# Patient Record
Sex: Male | Born: 1935 | Race: White | Hispanic: No | Marital: Married | Smoking: Never smoker
Health system: Southern US, Community
[De-identification: ages and names within clinical notes are randomized; demographics above are authoritative.]

## PROBLEM LIST (undated history)

## (undated) DIAGNOSIS — C6290 Malignant neoplasm of unspecified testis, unspecified whether descended or undescended: Secondary | ICD-10-CM

## (undated) DIAGNOSIS — C801 Malignant (primary) neoplasm, unspecified: Secondary | ICD-10-CM

## (undated) DIAGNOSIS — I4891 Unspecified atrial fibrillation: Secondary | ICD-10-CM

## (undated) DIAGNOSIS — C61 Malignant neoplasm of prostate: Secondary | ICD-10-CM

## (undated) HISTORY — PX: TESTICLE REMOVAL: SHX68

## (undated) HISTORY — PX: PROSTATE SURGERY: SHX751

---

## 2004-07-24 ENCOUNTER — Ambulatory Visit: Admission: RE | Admit: 2004-07-24 | Discharge: 2004-10-22 | Payer: Self-pay | Admitting: Radiation Oncology

## 2004-07-28 ENCOUNTER — Encounter: Admission: RE | Admit: 2004-07-28 | Discharge: 2004-07-28 | Payer: Self-pay | Admitting: Urology

## 2004-09-02 ENCOUNTER — Ambulatory Visit (HOSPITAL_BASED_OUTPATIENT_CLINIC_OR_DEPARTMENT_OTHER): Admission: RE | Admit: 2004-09-02 | Discharge: 2004-09-02 | Payer: Self-pay | Admitting: Urology

## 2004-09-02 ENCOUNTER — Ambulatory Visit (HOSPITAL_COMMUNITY): Admission: RE | Admit: 2004-09-02 | Discharge: 2004-09-02 | Payer: Self-pay | Admitting: Urology

## 2004-09-25 ENCOUNTER — Ambulatory Visit: Admission: RE | Admit: 2004-09-25 | Discharge: 2004-09-25 | Payer: Self-pay | Admitting: Radiation Oncology

## 2014-01-28 ENCOUNTER — Emergency Department (HOSPITAL_COMMUNITY): Payer: Medicare Other

## 2014-01-28 ENCOUNTER — Observation Stay (HOSPITAL_COMMUNITY)
Admission: EM | Admit: 2014-01-28 | Discharge: 2014-01-29 | Disposition: A | Discharge status: Home / Self Care | Payer: Medicare Other | Attending: Internal Medicine | Admitting: Internal Medicine

## 2014-01-28 ENCOUNTER — Encounter (HOSPITAL_COMMUNITY): Payer: Self-pay | Admitting: Emergency Medicine

## 2014-01-28 DIAGNOSIS — I482 Chronic atrial fibrillation, unspecified: Secondary | ICD-10-CM

## 2014-01-28 DIAGNOSIS — E785 Hyperlipidemia, unspecified: Secondary | ICD-10-CM | POA: Insufficient documentation

## 2014-01-28 DIAGNOSIS — I959 Hypotension, unspecified: Secondary | ICD-10-CM | POA: Diagnosis not present

## 2014-01-28 DIAGNOSIS — E860 Dehydration: Secondary | ICD-10-CM | POA: Diagnosis not present

## 2014-01-28 DIAGNOSIS — Z7901 Long term (current) use of anticoagulants: Secondary | ICD-10-CM | POA: Diagnosis not present

## 2014-01-28 DIAGNOSIS — I83893 Varicose veins of bilateral lower extremities with other complications: Principal | ICD-10-CM | POA: Insufficient documentation

## 2014-01-28 DIAGNOSIS — Z66 Do not resuscitate: Secondary | ICD-10-CM | POA: Insufficient documentation

## 2014-01-28 DIAGNOSIS — R58 Hemorrhage, not elsewhere classified: Secondary | ICD-10-CM | POA: Insufficient documentation

## 2014-01-28 DIAGNOSIS — Z8547 Personal history of malignant neoplasm of testis: Secondary | ICD-10-CM | POA: Diagnosis not present

## 2014-01-28 DIAGNOSIS — I83891 Varicose veins of right lower extremities with other complications: Secondary | ICD-10-CM

## 2014-01-28 DIAGNOSIS — Z8546 Personal history of malignant neoplasm of prostate: Secondary | ICD-10-CM | POA: Insufficient documentation

## 2014-01-28 DIAGNOSIS — N179 Acute kidney failure, unspecified: Secondary | ICD-10-CM | POA: Insufficient documentation

## 2014-01-28 DIAGNOSIS — I10 Essential (primary) hypertension: Secondary | ICD-10-CM | POA: Diagnosis not present

## 2014-01-28 DIAGNOSIS — Z8673 Personal history of transient ischemic attack (TIA), and cerebral infarction without residual deficits: Secondary | ICD-10-CM | POA: Diagnosis not present

## 2014-01-28 DIAGNOSIS — C61 Malignant neoplasm of prostate: Secondary | ICD-10-CM

## 2014-01-28 DIAGNOSIS — I4891 Unspecified atrial fibrillation: Secondary | ICD-10-CM | POA: Diagnosis present

## 2014-01-28 DIAGNOSIS — I83899 Varicose veins of unspecified lower extremities with other complications: Secondary | ICD-10-CM | POA: Diagnosis present

## 2014-01-28 HISTORY — DX: Unspecified atrial fibrillation: I48.91

## 2014-01-28 HISTORY — DX: Malignant neoplasm of unspecified testis, unspecified whether descended or undescended: C62.90

## 2014-01-28 HISTORY — DX: Malignant neoplasm of prostate: C61

## 2014-01-28 HISTORY — DX: Malignant (primary) neoplasm, unspecified: C80.1

## 2014-01-28 LAB — CBC WITH DIFFERENTIAL/PLATELET
BASOS ABS: 0 10*3/uL (ref 0.0–0.1)
Basophils Relative: 0 % (ref 0–1)
EOS ABS: 0.1 10*3/uL (ref 0.0–0.7)
EOS PCT: 1 % (ref 0–5)
HEMATOCRIT: 38.9 % — AB (ref 39.0–52.0)
HEMOGLOBIN: 13.4 g/dL (ref 13.0–17.0)
Lymphocytes Relative: 11 % — ABNORMAL LOW (ref 12–46)
Lymphs Abs: 1.1 10*3/uL (ref 0.7–4.0)
MCH: 31.8 pg (ref 26.0–34.0)
MCHC: 34.4 g/dL (ref 30.0–36.0)
MCV: 92.4 fL (ref 78.0–100.0)
Monocytes Absolute: 0.9 10*3/uL (ref 0.1–1.0)
Monocytes Relative: 9 % (ref 3–12)
NEUTROS ABS: 8.2 10*3/uL — AB (ref 1.7–7.7)
NEUTROS PCT: 79 % — AB (ref 43–77)
PLATELETS: 161 10*3/uL (ref 150–400)
RBC: 4.21 MIL/uL — ABNORMAL LOW (ref 4.22–5.81)
RDW: 13.5 % (ref 11.5–15.5)
WBC: 10.3 10*3/uL (ref 4.0–10.5)

## 2014-01-28 LAB — COMPREHENSIVE METABOLIC PANEL
ALK PHOS: 60 U/L (ref 39–117)
ALT: 14 U/L (ref 0–53)
AST: 22 U/L (ref 0–37)
Albumin: 3.3 g/dL — ABNORMAL LOW (ref 3.5–5.2)
Anion gap: 16 — ABNORMAL HIGH (ref 5–15)
BUN: 24 mg/dL — AB (ref 6–23)
CALCIUM: 9 mg/dL (ref 8.4–10.5)
CO2: 22 mEq/L (ref 19–32)
Chloride: 106 mEq/L (ref 96–112)
Creatinine, Ser: 1.45 mg/dL — ABNORMAL HIGH (ref 0.50–1.35)
GFR calc Af Amer: 50 mL/min — ABNORMAL LOW (ref >90)
GFR, EST NON AFRICAN AMERICAN: 43 mL/min — AB (ref >90)
GLUCOSE: 120 mg/dL — AB (ref 70–99)
Potassium: 4.2 mEq/L (ref 3.7–5.3)
SODIUM: 144 meq/L (ref 137–147)
TOTAL PROTEIN: 6.2 g/dL (ref 6.0–8.3)
Total Bilirubin: 0.4 mg/dL (ref 0.3–1.2)

## 2014-01-28 LAB — URINALYSIS, ROUTINE W REFLEX MICROSCOPIC
BILIRUBIN URINE: NEGATIVE
Glucose, UA: NEGATIVE mg/dL
Hgb urine dipstick: NEGATIVE
KETONES UR: NEGATIVE mg/dL
LEUKOCYTES UA: NEGATIVE
Nitrite: NEGATIVE
PROTEIN: NEGATIVE mg/dL
SPECIFIC GRAVITY, URINE: 1.018 (ref 1.005–1.030)
Urobilinogen, UA: 0.2 mg/dL (ref 0.0–1.0)
pH: 6 (ref 5.0–8.0)

## 2014-01-28 LAB — LACTIC ACID, PLASMA: LACTIC ACID, VENOUS: 3.6 mmol/L — AB (ref 0.5–2.2)

## 2014-01-28 MED ORDER — SODIUM CHLORIDE 0.9 % IV BOLUS (SEPSIS)
1000.0000 mL | Freq: Once | INTRAVENOUS | Status: AC
  Administered 2014-01-28: 1000 mL via INTRAVENOUS

## 2014-01-28 MED ORDER — ACETAMINOPHEN 325 MG PO TABS
650.0000 mg | ORAL_TABLET | Freq: Once | ORAL | Status: AC
  Administered 2014-01-28: 650 mg via ORAL
  Filled 2014-01-28: qty 2

## 2014-01-28 MED ORDER — METOPROLOL TARTRATE 1 MG/ML IV SOLN
2.5000 mg | Freq: Once | INTRAVENOUS | Status: AC
  Administered 2014-01-28: 2.5 mg via INTRAVENOUS
  Filled 2014-01-28: qty 5

## 2014-01-28 NOTE — ED Provider Notes (Signed)
CSN: [id number]     Arrival date & time 01/28/14  1635 History   First MD Initiated Contact with Patient 01/28/14 1647     Chief Complaint  Patient presents with  . Coagulation Disorder     (Consider location/radiation/quality/duration/timing/severity/associated sxs/prior Treatment) HPI  Colton Bonilla is a 80 y.o. male with a past medical history of atrial fibrillation, and prostate cancer, presenting for blood loss and hypotension. Patient was in the bathroom earlier today taking off his pants in preparation for a shower, and he injured an area on his ankle which started bleeding profusely. Patient is not sure how much blood he lost, but states that he felt weak and laid down. Denies falling or loss of consciousness. Family contacted EMS and a pressure dressing was applied to the patient's ankle upon arrival. Bleeding controlled following pressure dressing, however EMS concerned for arterial bleeding due to profuse bleeding. EMS unable to obtain pulses following application of pressure dressing. Patient is currently on Pradaxa for his atrial fibrillation. Following arrival of family, they state that the patient had been mowing today for approximately 3-1/2 hours. Stated the patient does not drink very much water on a regular basis and had not had any water following mowing today as he was about to take a shower before his bleeding started. Patient denies chest pain, shortness of breath, nausea, vomiting, palpitations, diarrhea. Endorses generalized weakness and feeling "faint", and dizzy.     Past Medical History  Diagnosis Date  . A-fib   . Cancer   . Prostate cancer   . Testicular cancer    Past Surgical History  Procedure Laterality Date  . Testicle removal Right   . Prostate surgery     Family History  Problem Relation Age of Onset  . Cancer Father    History  Substance Use Topics  . Smoking status: Never Smoker   . Smokeless tobacco: Never Used  . Alcohol Use: No     Review of Systems  HENT: Negative.   Eyes: Negative.   Respiratory: Negative.   Cardiovascular: Negative.   Gastrointestinal: Negative.   Endocrine: Negative.   Genitourinary: Negative.   Musculoskeletal: Negative.   Skin: Positive for wound.  Allergic/Immunologic: Negative.   Neurological: Positive for weakness.  Hematological: Negative.   Psychiatric/Behavioral: Negative.       Allergies  Review of patient's allergies indicates no known allergies.  Home Medications   Prior to Admission medications   Medication Sig Start Date End Date Taking? Authorizing Provider  acetaminophen (TYLENOL) 500 MG tablet Take 500 mg by mouth every 6 (six) hours as needed for moderate pain.   Yes Historical Provider, MD  Ascorbic Acid (VITAMIN C) 1000 MG tablet Take 1,000 mg by mouth daily.   Yes Historical Provider, MD  Bromfenac Sodium (PROLENSA) 0.07 % SOLN Place 1 drop into the left eye every evening.   Yes Historical Provider, MD  metoprolol tartrate (LOPRESSOR) 25 MG tablet Take 6.25 mg by mouth daily as needed (for high BP).   Yes Historical Provider, MD  Multiple Vitamin (MULTIVITAMIN WITH MINERALS) TABS tablet Take 1 tablet by mouth daily.   Yes Historical Provider, MD  Omega-3 Fatty Acids (FISH OIL) 1000 MG CPDR Take 1,000 mg by mouth daily.   Yes Historical Provider, MD  PRADAXA 150 MG CAPS capsule Take 150 mg by mouth 2 (two) times daily. 01/02/14  Yes Historical Provider, MD  simvastatin (ZOCOR) 40 MG tablet Take 40 mg by mouth daily at 6 PM.   Yes  Historical Provider, MD   BP 106/83  Pulse 111  Resp 15  SpO2 99% Physical Exam  Nursing note and vitals reviewed. Constitutional: He is oriented to person, place, and time. He appears well-developed and well-nourished. He appears distressed.  HENT:  Head: Normocephalic and atraumatic.  Right Ear: External ear normal.  Left Ear: External ear normal.  Nose: Nose normal.  Mouth/Throat: Oropharynx is clear and moist. No  oropharyngeal exudate.  Eyes: Conjunctivae and EOM are normal. Pupils are equal, round, and reactive to light. Right eye exhibits no discharge. Left eye exhibits no discharge. No scleral icterus.  Neck: Normal range of motion. Neck supple. No JVD present. No tracheal deviation present. No thyromegaly present.  Cardiovascular: Normal heart sounds and intact distal pulses.  An irregularly irregular rhythm present. Tachycardia present.  Exam reveals no gallop and no friction rub.   No murmur heard. Pulmonary/Chest: Effort normal and breath sounds normal. No stridor. No respiratory distress. He has no wheezes. He has no rales. He exhibits no tenderness.  Abdominal: Soft. Bowel sounds are normal. He exhibits no distension. There is no tenderness. There is no rebound and no guarding.  Musculoskeletal: Normal range of motion. He exhibits no edema and no tenderness.  Lymphadenopathy:    He has no cervical adenopathy.  Neurological: He is alert and oriented to person, place, and time. He has normal strength. He is not disoriented. He displays no atrophy and no tremor. No cranial nerve deficit or sensory deficit. He exhibits normal muscle tone. He displays no seizure activity. Coordination normal. GCS eye subscore is 4. GCS verbal subscore is 5. GCS motor subscore is 6.  Skin: Skin is warm. Lesion noted. No rash noted. He is diaphoretic. No erythema. No pallor.     Psychiatric: He has a normal mood and affect. His behavior is normal. Judgment and thought content normal.    ED Course  Procedures (including critical care time) Labs Review Labs Reviewed  CBC WITH DIFFERENTIAL - Abnormal; Notable for the following:    RBC 4.21 (*)    HCT 38.9 (*)    Neutrophils Relative % 79 (*)    Neutro Abs 8.2 (*)    Lymphocytes Relative 11 (*)    All other components within normal limits  COMPREHENSIVE METABOLIC PANEL - Abnormal; Notable for the following:    Glucose, Bld 120 (*)    BUN 24 (*)    Creatinine, Ser  1.45 (*)    Albumin 3.3 (*)    GFR calc non Af Amer 43 (*)    GFR calc Af Amer 50 (*)    Anion gap 16 (*)    All other components within normal limits  LACTIC ACID, PLASMA - Abnormal; Notable for the following:    Lactic Acid, Venous 3.6 (*)    All other components within normal limits  URINALYSIS, ROUTINE W REFLEX MICROSCOPIC - Abnormal; Notable for the following:    APPearance CLOUDY (*)    All other components within normal limits  APTT - Abnormal; Notable for the following:    aPTT 41 (*)    All other components within normal limits  PROTIME-INR - Abnormal; Notable for the following:    Prothrombin Time 18.9 (*)    INR 1.58 (*)    All other components within normal limits  RETICULOCYTES - Abnormal; Notable for the following:    RBC. 3.59 (*)    All other components within normal limits  CBC - Abnormal; Notable for the following:  RBC 3.59 (*)    Hemoglobin 11.5 (*)    HCT 33.1 (*)    Platelets 148 (*)    All other components within normal limits    Imaging Review Dg Chest Portable 1 View  01/28/2014   CLINICAL DATA:  Shortness of breath  EXAM: PORTABLE CHEST - 1 VIEW  COMPARISON:  06/17/2011  FINDINGS: The heart size and mediastinal contours are within normal limits. Both lungs are clear. The visualized skeletal structures are unremarkable.  IMPRESSION: No active disease.   Electronically Signed   By: Kathreen Devoid   On: 01/28/2014 17:35     EKG Interpretation   Date/Time:  Monday January 28 2014 16:52:46 EDT Ventricular Rate:  123 PR Interval:    QRS Duration: 85 QT Interval:  363 QTC Calculation: 519 R Axis:   60 Text Interpretation:  Atrial fibrillation with rapid ventricular response  Borderline low voltage, extremity leads Prolonged QT interval No old  tracing to compare Confirmed by [facility]  MD, DAVID ([id number]) on 01/28/2014  5:11:16 PM      MDM   Final diagnoses:  Bleeding from varicose vein, right  Dehydration    Upon arrival to the emergency  department patient noted to be hypotensive, tachycardic. Based on EMS description patient did not have significant blood loss, approximately 50 cc. Initially concerned primarily for other causes of hypotension based on patient's presentation. Normal saline boluses administered and initial laboratory workup started. Upon obtaining information from family, patient's presentation and history much more consistent with dehydration. Laboratory workup shows AKI and mild lactic acidosis consistent with dehydration patient's hypotension improved with fluid boluses, and heart rate improved with a 2.5 mg dose of IV metoprolol. No acute abnormalities noted on workup studies to suggest alternate etiology. Patient improved symptomatically with fluids and rate control. Will need observation for further management of AKI, and dehydration.  Pt discussed with hospitalist service.  Patient care was discussed with my attending, Dr. Roxanne Mins.   Hoyle Sauer, MD 01/29/14 [phone]

## 2014-01-28 NOTE — H&P (Signed)
[facility] History and Physical  Patient: Colton Bonilla  SWF:[id number]  DOB: 1929-12-13  DOS: the patient was seen and examined on 01/28/2014 PCP: Ann Held, MD  Chief Complaint: Bleeding from the leg  HPI: Colton Bonilla is a 80 y.o. male with Past medical history of atrial fibrillation, prostate cancer, hypertension, dyslipidemia, CVA. The patient presented with complaints of a bleeding episode noted from the right leg. 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 The patient and his wife feel that he had a bump at that area which broke off and started bleeding. The patient is on Protonix and since last 3 years for his history of CVA leading to A. fib. He denies any further bleeding episode he denies any bleeding anywhere else. Denies any injury or lightheadedness. He complains of fatigue and tiredness and exhaustion. He also complains of being dehydrated. He denies any heat or chills burning urination. There is no recent change in his medication.  The patient is coming from home. And at his baseline independent for most of his ADL.  Review of Systems: as mentioned in the history of present illness.  A Comprehensive review of the other systems is negative.  Past Medical History  Diagnosis Date  . A-fib   . Cancer   . Prostate cancer   . Testicular cancer    Past Surgical History  Procedure Laterality Date  . Testicle removal Right    Social History:  reports that he has never smoked. He has never used smokeless tobacco. He reports that he does not drink alcohol or use illicit drugs.  No Known Allergies  History reviewed. No pertinent family history.  Prior to Admission medications   Medication Sig Start Date End Date Taking? Authorizing Provider  acetaminophen (TYLENOL) 500 MG tablet  Take 500 mg by mouth every 6 (six) hours as needed for moderate pain.   Yes Historical Provider, MD  Ascorbic Acid (VITAMIN C) 1000 MG tablet Take 1,000 mg by mouth daily.   Yes Historical Provider, MD  Bromfenac Sodium (PROLENSA) 0.07 % SOLN Place 1 drop into the left eye every evening.   Yes Historical Provider, MD  metoprolol tartrate (LOPRESSOR) 25 MG tablet Take 6.25 mg by mouth daily as needed (for high BP).   Yes Historical Provider, MD  Multiple Vitamin (MULTIVITAMIN WITH MINERALS) TABS tablet Take 1 tablet by mouth daily.   Yes Historical Provider, MD  Omega-3 Fatty Acids (FISH OIL) 1000 MG CPDR Take 1,000 mg by mouth daily.   Yes Historical Provider, MD  PRADAXA 150 MG CAPS capsule Take 150 mg by mouth 2 (two) times daily. 01/02/14  Yes Historical Provider, MD  simvastatin (ZOCOR) 40 MG tablet Take 40 mg by mouth daily at 6 PM.   Yes Historical Provider, MD    Physical Exam: Filed Vitals:   01/28/14 2130 01/28/14 2215 01/28/14 2245 01/28/14 2315  BP: 102/67 113/74 108/70 109/77  Pulse: 92 92 93 100  Temp:   98.5 F (36.9 C)   TempSrc:   Oral   Resp: 18 22 20 21   SpO2: 97% 98% 98% 97%    General: Alert, Awake and Oriented to Time, Place and Person. Appear in mild distress Eyes: PERRL ENT: Oral Mucosa clear dry. Neck: no JVD Cardiovascular: S1 and S2  Present, aortic systolic Murmur, Peripheral Pulses Present Respiratory: Bilateral Air entry equal and Decreased, Clear to Auscultation, noCrackles, no wheezes Abdomen: Bowel Sound Present, Soft and Non tender Skin: no Rash Extremities: no Pedal edema, no calf tenderness, small ulcer with redness but no bleeding on the right foot Neurologic: Grossly no focal neuro deficit.  Labs on Admission:  CBC:  Recent Labs Lab 01/28/14 1755  WBC 10.3  NEUTROABS 8.2*  HGB 13.4  HCT 38.9*  MCV 92.4  PLT 161    CMP     Component Value Date/Time   NA 144 01/28/2014 1755   K 4.2 01/28/2014 1755   CL 106 01/28/2014 1755   CO2 22  01/28/2014 1755   GLUCOSE 120* 01/28/2014 1755   BUN 24* 01/28/2014 1755   CREATININE 1.45* 01/28/2014 1755   CALCIUM 9.0 01/28/2014 1755   PROT 6.2 01/28/2014 1755   ALBUMIN 3.3* 01/28/2014 1755   AST 22 01/28/2014 1755   ALT 14 01/28/2014 1755   ALKPHOS 60 01/28/2014 1755   BILITOT 0.4 01/28/2014 1755   GFRNONAA 43* 01/28/2014 1755   GFRAA 50* 01/28/2014 1755    No results found for this basename: LIPASE, AMYLASE,  in the last 168 hours No results found for this basename: AMMONIA,  in the last 168 hours  No results found for this basename: CKTOTAL, CKMB, CKMBINDEX, TROPONINI,  in the last 168 hours BNP (last 3 results) No results found for this basename: PROBNP,  in the last 8760 hours  Radiological Exams on Admission: Dg Chest Portable 1 View  01/28/2014   CLINICAL DATA:  Shortness of breath  EXAM: PORTABLE CHEST - 1 VIEW  COMPARISON:  06/17/2011  FINDINGS: The heart size and mediastinal contours are within normal limits. Both lungs are clear. The visualized skeletal structures are unremarkable.  IMPRESSION: No active disease.   Electronically Signed   By: Kathreen Devoid   On: 01/28/2014 17:35    Assessment/Plan Principal Problem:   Bleeding from varicose vein Active Problems:   Dehydration   A-fib   Prostate cancer   1. Bleeding from varicose vein The patient is presenting with complaints of bleeding from the the foot possibly of varicose vein. The bleeding at present has stopped. Patient is on pradexa for A. Fib. At present the bleeding to stop and we'll continue to monitor him for the same. We'll repeat his H&H. Hold vertex at present. Also holding SCD at present.  2. Dehydration. Patient has been working outside in the heat and appears to have mild elevation of his BUN and creatinine. He also has mild elevation of her lactic acid With which at present I would continue him on IV hydration and monitor him on telemetry.  3. A. fib with RVR. Patient also had A. fib with RVR. Which  has improved significantly with IV hydration and Lopressor. At present I would start him on Lopressor 12.5 mg twice a day and continue to monitor him on telemetry. Continue IV hydration.  4. History of prostate cancer. Continue home medication.  DVT Prophylaxis: on chronic anticoagulation Nutrition: Cardiac diet  Code Status: DO NOT RESUSCITATE  Family Communication: Wife was present at bedside, opportunity was given to ask question and all questions were answered satisfactorily at the time of interview. Disposition: Admitted to observation in telemetry unit.  Author: Berle Mull, MD [facility] Pager: [phone] 01/28/2014, 11:49 PM    If 7PM-7AM, please contact night-coverage [email or web address] Password [email or web address]  **Disclaimer: This note may have been dictated with voice  recognition software. Similar sounding words can inadvertently be transcribed and this note may contain transcription errors which may not have been corrected upon publication of note.**

## 2014-01-28 NOTE — ED Notes (Signed)
Warm fluids hung on pt. Pt was shivering under warm blankets. Thermostat turned up in room

## 2014-01-28 NOTE — ED Notes (Signed)
Pt in from home via [facility], per report pt had elevated raised area on R foot that was removed accidentally before taking a shower, per report pt lost 50 cc estimated of blood, fire dept wrapped foot, bleeding controlled upon arrival to ED, pt in a-fib, with hx of a fib, pt takes Pradaxa, pt A&O x 4, follows commands, speaks in complete sentences, denies CP, SOB, N/v/d, pt 500 mL NS PTA

## 2014-01-28 NOTE — ED Notes (Signed)
Portable xray completed

## 2014-01-28 NOTE — ED Notes (Addendum)
Dr. Estanislado Spire back in to speak with family and pt. MD okay with pt receiving home eye drops to his left eye for cataract.

## 2014-01-28 NOTE — ED Provider Notes (Signed)
80 year old male who is anticoagulated because of atrial fibrillation started bleeding from his left leg as he was going to go in the shower. EMS arrived and then noted that he was hypotensive. Dressing was applied and he was brought to the ED where her blood pressure is noted to be adequate. He is in atrial fibrillation with a slightly rapid ventricular response. Bleeding appears to have come from a varicose vein but bleeding has stopped.  I saw and evaluated the patient, reviewed the resident's note and I agree with the findings and plan.   EKG Interpretation   Date/Time:  Monday January 28 2014 16:52:46 EDT Ventricular Rate:  123 PR Interval:    QRS Duration: 85 QT Interval:  363 QTC Calculation: 519 R Axis:   60 Text Interpretation:  Atrial fibrillation with rapid ventricular response  Borderline low voltage, extremity leads Prolonged QT interval No old  tracing to compare Confirmed by [facility]  MD, Chenee Munns ([id number]) on 01/28/2014  5:11:16 PM        Delora Fuel, MD 80/03/49 1791

## 2014-01-28 NOTE — ED Notes (Signed)
Attempted to call report

## 2014-01-28 NOTE — ED Notes (Signed)
Pt c/o left sided ha at this time.

## 2014-01-29 DIAGNOSIS — I4891 Unspecified atrial fibrillation: Secondary | ICD-10-CM | POA: Diagnosis present

## 2014-01-29 DIAGNOSIS — I83899 Varicose veins of unspecified lower extremities with other complications: Secondary | ICD-10-CM | POA: Diagnosis present

## 2014-01-29 DIAGNOSIS — C61 Malignant neoplasm of prostate: Secondary | ICD-10-CM | POA: Diagnosis present

## 2014-01-29 DIAGNOSIS — I83893 Varicose veins of bilateral lower extremities with other complications: Secondary | ICD-10-CM

## 2014-01-29 LAB — CBC
HCT: 34.4 % — ABNORMAL LOW (ref 39.0–52.0)
HEMATOCRIT: 33.1 % — AB (ref 39.0–52.0)
Hemoglobin: 11.5 g/dL — ABNORMAL LOW (ref 13.0–17.0)
Hemoglobin: 11.5 g/dL — ABNORMAL LOW (ref 13.0–17.0)
MCH: 32 pg (ref 26.0–34.0)
MCH: 31.6 pg (ref 26.0–34.0)
MCHC: 33.4 g/dL (ref 30.0–36.0)
MCHC: 34.7 g/dL (ref 30.0–36.0)
MCV: 92.2 fL (ref 78.0–100.0)
MCV: 94.5 fL (ref 78.0–100.0)
PLATELETS: 153 10*3/uL (ref 150–400)
Platelets: 148 10*3/uL — ABNORMAL LOW (ref 150–400)
RBC: 3.59 MIL/uL — ABNORMAL LOW (ref 4.22–5.81)
RBC: 3.64 MIL/uL — ABNORMAL LOW (ref 4.22–5.81)
RDW: 13.5 % (ref 11.5–15.5)
RDW: 13.8 % (ref 11.5–15.5)
WBC: 6.1 10*3/uL (ref 4.0–10.5)
WBC: 6.2 10*3/uL (ref 4.0–10.5)

## 2014-01-29 LAB — RETICULOCYTES
RBC.: 3.59 MIL/uL — AB (ref 4.22–5.81)
Retic Count, Absolute: 61 10*3/uL (ref 19.0–186.0)
Retic Ct Pct: 1.7 % (ref 0.4–3.1)

## 2014-01-29 LAB — PROTIME-INR
INR: 1.58 — AB (ref 0.00–1.49)
PROTHROMBIN TIME: 18.9 s — AB (ref 11.6–15.2)

## 2014-01-29 LAB — BASIC METABOLIC PANEL
ANION GAP: 9 (ref 5–15)
BUN: 15 mg/dL (ref 6–23)
CALCIUM: 8.5 mg/dL (ref 8.4–10.5)
CO2: 24 mEq/L (ref 19–32)
CREATININE: 0.98 mg/dL (ref 0.50–1.35)
Chloride: 109 mEq/L (ref 96–112)
GFR calc Af Amer: 86 mL/min — ABNORMAL LOW (ref >90)
GFR, EST NON AFRICAN AMERICAN: 74 mL/min — AB (ref >90)
Glucose, Bld: 97 mg/dL (ref 70–99)
Potassium: 4.1 mEq/L (ref 3.7–5.3)
Sodium: 142 mEq/L (ref 137–147)

## 2014-01-29 LAB — APTT: aPTT: 41 seconds — ABNORMAL HIGH (ref 24–37)

## 2014-01-29 MED ORDER — METOPROLOL TARTRATE 12.5 MG HALF TABLET
12.5000 mg | ORAL_TABLET | Freq: Two times a day (BID) | ORAL | Status: DC
  Administered 2014-01-29: 12.5 mg via ORAL
  Filled 2014-01-29 (×2): qty 1

## 2014-01-29 MED ORDER — ONDANSETRON HCL 4 MG PO TABS: 4.0000 mg | ORAL_TABLET | Freq: Four times a day (QID) | ORAL | Status: DC | PRN

## 2014-01-29 MED ORDER — ACETAMINOPHEN 650 MG RE SUPP: 650.0000 mg | Freq: Four times a day (QID) | RECTAL | Status: DC | PRN

## 2014-01-29 MED ORDER — SIMVASTATIN 40 MG PO TABS
40.0000 mg | ORAL_TABLET | Freq: Every day | ORAL | Status: DC
  Filled 2014-01-29: qty 1

## 2014-01-29 MED ORDER — ONDANSETRON HCL 4 MG/2ML IJ SOLN: 4.0000 mg | Freq: Four times a day (QID) | INTRAMUSCULAR | Status: DC | PRN

## 2014-01-29 MED ORDER — SODIUM CHLORIDE 0.9 % IV SOLN
INTRAVENOUS | Status: DC
  Administered 2014-01-29: 02:00:00 via INTRAVENOUS

## 2014-01-29 MED ORDER — SODIUM CHLORIDE 0.9 % IJ SOLN
3.0000 mL | Freq: Two times a day (BID) | INTRAMUSCULAR | Status: DC
  Administered 2014-01-29: 3 mL via INTRAVENOUS

## 2014-01-29 MED ORDER — ACETAMINOPHEN 325 MG PO TABS: 650.0000 mg | ORAL_TABLET | Freq: Four times a day (QID) | ORAL | Status: DC | PRN

## 2014-01-29 NOTE — Discharge Instructions (Signed)
Acute Kidney Injury Acute kidney injury is a disease in which there is sudden (acute) damage to the kidneys. The kidneys are 2 organs that lie on either side of the spine between the middle of the back and the front of the abdomen. The kidneys:  Remove wastes and extra water from the blood.   Produce important hormones. These help keep bones strong, regulate blood pressure, and help create red blood cells.   Balance the fluids and chemicals in the blood and tissues. A small amount of kidney damage may not cause problems, but a large amount of damage may make it difficult or impossible for the kidneys to work the way they should. Acute kidney injury may develop into long-lasting (chronic) kidney disease. It may also develop into a life-threatening disease called end-stage kidney disease. Acute kidney injury can get worse very quickly, so it should be treated right away. Early treatment may prevent other kidney diseases from developing.  CAUSES   A problem with blood flow to the kidneys. This may be caused by:   Blood loss.   Heart disease.   Severe burns.   Liver disease.  Direct damage to the kidneys. This may be caused by:  Some medicines.   A kidney infection.   Poisoning or consuming toxic substances.   A surgical wound.   A blow to the kidney area.   A problem with urine flow. This may be caused by:   Cancer.   Kidney stones.   An enlarged prostate. SYMPTOMS   Swelling (edema) of the legs, ankles, or feet.   Tiredness (lethargy).   Nausea or vomiting.   Confusion.   Problems with urination, such as:   Painful or burning feeling during urination.   Decreased urine production.   Frequent accidents in children who are potty trained.   Bloody urine.   Muscle twitches and cramps.   Shortness of breath.   Seizures.   Chest pain or pressure. Sometimes, no symptoms are present. DIAGNOSIS Acute kidney injury may be detected  and diagnosed by tests, including blood, urine, imaging, or kidney biopsy tests.  TREATMENT Treatment of acute kidney injury varies depending on the cause and severity of the kidney damage. In mild cases, no treatment may be needed. The kidneys may heal on their own. If acute kidney injury is more severe, your caregiver will treat the cause of the kidney damage, help the kidneys heal, and prevent complications from occurring. Severe cases may require a procedure to remove toxic wastes from the body (dialysis) or surgery to repair kidney damage. Surgery may involve:   Repair of a torn kidney.   Removal of an obstruction. Most of the time, you will need to stay overnight at the hospital.  HOME CARE INSTRUCTIONS:  Follow your prescribed diet.  Only take over-the-counter or prescription medicines as directed by your caregiver.  Do not take any new medicines (prescription, over-the-counter, or nutritional supplements) unless approved by your caregiver. Many medicines can worsen your kidney damage or need to have the dose adjusted.   Keep all follow-up appointments as directed by your caregiver.  Observe your condition to make sure you are healing as expected. SEEK IMMEDIATE MEDICAL CARE IF:  You are feeling ill or have severe pain in the back or side.   Your symptoms return or you have new symptoms.  You have any symptoms of end-stage kidney disease. These include:   Persistent itchiness.   Loss of appetite.   Headaches.   Abnormally dark   or light skin.  Numbness in the hands or feet.   Easy bruising.   Frequent hiccups.   Menstruation stops.   You have a fever.  You have increased urine production.  You have pain or bleeding when urinating. MAKE SURE YOU:   Understand these instructions.  Will watch your condition.  Will get help right away if you are not doing well or get worse Document Released: 12/07/2010 Document Revised: 09/18/2012 Document  Reviewed: 01/21/2012 [employer] Patient Information 2015 [employer]. This information is not intended to replace advice given to you by your health care provider. Make sure you discuss any questions you have with your health care provider.  

## 2014-01-29 NOTE — Discharge Summary (Signed)
Physician Discharge Summary  Colton Bonilla NLZ:[id number] DOB: [birth year] DOA: 01/28/2014  PCP: Ann Held, MD  Admit date: 01/28/2014 Discharge date: 01/29/2014  Recommendations for Outpatient Follow-up:  1. Pt will need to follow up with PCP in 2-3 weeks post discharge 2. Please obtain BMP to evaluate electrolytes and kidney function 3. Please also check CBC to evaluate Hg and Hct levels  Discharge Diagnoses:  Principal Problem:   Bleeding from varicose vein Active Problems:   Dehydration   A-fib   Prostate cancer  Discharge Condition: Stable  Diet recommendation: Heart healthy diet discussed in details   History of present illness:  80 y.o. male with atrial fibrillation, prostate cancer, hypertension, dyslipidemia, CVA, presented with complaints of a bleeding episode noted from the right leg. 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 The patient and his wife felt that he had a bump at that area which broke off and started bleeding.   Hospital Course:  Principal Problem:   Bleeding from varicose vein - bleeding stopped and Hg remains stable - pt ambulating well and wants to go home  Active Problems:   Dehydration - tolerating regular diet well, drinking liquids    A-fib - rate controlled - continue Pradaxa    Prostate cancer - outpatient follow up    Procedures/Studies: Dg Chest Portable 1 View 01/28/2014 IMPRESSION: No active disease.     Consultations:  None  Antibiotics:  None   Discharge Exam: Filed Vitals:   01/29/14 1316  BP: 96/69  Pulse: 86  Temp: 97.8 F (36.6 C)  Resp: 19   Filed Vitals:   01/29/14 0010 01/29/14 0522 01/29/14 1041 01/29/14 1316  BP: 106/62 115/85 95/72 96/69   Pulse: 86 85 88 86  Temp: 97.7 F (36.5 C) 98.2 F (36.8 C)  97.8 F (36.6 C)   TempSrc: Oral Oral  Oral  Resp: 20 20  19   Height:  5\' 11"  (1.803 m)    Weight: 86.138 kg (189 lb 14.4 oz)     SpO2: 97% 98%  98%    General: Pt is alert, follows commands appropriately, not in acute distress Cardiovascular: Regular rate and rhythm, S1/S2 +, no murmurs, no rubs, no gallops Respiratory: Clear to auscultation bilaterally, no wheezing, no crackles, no rhonchi Abdominal: Soft, non tender, non distended, bowel sounds +, no guarding Extremities: no edema, no cyanosis, pulses palpable bilaterally DP and PT Neuro: Grossly nonfocal  Discharge Instructions  Discharge Instructions   Diet - low sodium heart healthy    Complete by:  As directed      Increase activity slowly    Complete by:  As directed             Medication List         acetaminophen 500 MG tablet  Commonly known as:  TYLENOL  Take 500 mg by mouth every 6 (six) hours as needed for moderate pain.     Fish Oil 1000 MG Cpdr  Take 1,000 mg by mouth daily.     metoprolol tartrate 25 MG tablet  Commonly known as:  LOPRESSOR  Take 6.25 mg by mouth daily as needed (for high BP).     multivitamin with minerals Tabs tablet  Take 1 tablet by mouth daily.     PRADAXA 150  MG Caps capsule  Generic drug:  dabigatran  Take 150 mg by mouth 2 (two) times daily.     PROLENSA 0.07 % Soln  Generic drug:  Bromfenac Sodium  Place 1 drop into the left eye every evening.     simvastatin 40 MG tablet  Commonly known as:  ZOCOR  Take 40 mg by mouth daily at 6 PM.     vitamin C 1000 MG tablet  Take 1,000 mg by mouth daily.           Follow-up Information   Schedule an appointment as soon as possible for a visit with Ann Held, MD.   Specialty:  Family Medicine   Contact information:   [place] [address] [phone]       Follow up with Faye Ramsay, MD. (As needed call my cell phone [phone])    Specialty:  Internal Medicine   Contact information:   [address] [place] [address] [phone]        The results of significant diagnostics from this hospitalization (including imaging, microbiology, ancillary and laboratory) are listed below for reference.     Microbiology: No results found for this or any previous visit (from the past 240 hour(s)).   Labs: Basic Metabolic Panel:  Recent Labs Lab 01/28/14 1755 01/29/14 1200  NA 144 142  K 4.2 4.1  CL 106 109  CO2 22 24  GLUCOSE 120* 97  BUN 24* 15  CREATININE 1.45* 0.98  CALCIUM 9.0 8.5   Liver Function Tests:  Recent Labs Lab 01/28/14 1755  AST 22  ALT 14  ALKPHOS 60  BILITOT 0.4  PROT 6.2  ALBUMIN 3.3*   No results found for this basename: LIPASE, AMYLASE,  in the last 168 hours No results found for this basename: AMMONIA,  in the last 168 hours CBC:  Recent Labs Lab 01/28/14 1755 01/29/14 0052 01/29/14 1200  WBC 10.3 6.1 6.2  NEUTROABS 8.2*  --   --   HGB 13.4 11.5* 11.5*  HCT 38.9* 33.1* 34.4*  MCV 92.4 92.2 94.5  PLT 161 148* 153   Cardiac Enzymes: No results found for this basename: CKTOTAL, CKMB, CKMBINDEX, TROPONINI,  in the last 168 hours BNP: BNP (last 3 results) No results found for this basename: PROBNP,  in the last 8760 hours CBG: No results found for this basename: GLUCAP,  in the last 168 hours   SIGNED: Time coordinating discharge: Over 30 minutes  Faye Ramsay, MD  [facility] 01/29/2014, 1:44 PM Pager [phone]  If 7PM-7AM, please contact night-coverage [email or web address] Password [email or web address]

## 2014-01-29 NOTE — Progress Notes (Signed)
UR completed 

## 2014-07-15 DIAGNOSIS — C61 Malignant neoplasm of prostate: Secondary | ICD-10-CM | POA: Diagnosis not present

## 2014-07-15 DIAGNOSIS — R339 Retention of urine, unspecified: Secondary | ICD-10-CM | POA: Diagnosis not present

## 2014-10-14 DIAGNOSIS — R7309 Other abnormal glucose: Secondary | ICD-10-CM | POA: Diagnosis not present

## 2014-10-14 DIAGNOSIS — E782 Mixed hyperlipidemia: Secondary | ICD-10-CM | POA: Diagnosis not present

## 2014-10-14 DIAGNOSIS — Z79899 Other long term (current) drug therapy: Secondary | ICD-10-CM | POA: Diagnosis not present

## 2014-10-14 DIAGNOSIS — Z9181 History of falling: Secondary | ICD-10-CM | POA: Diagnosis not present

## 2014-10-14 DIAGNOSIS — I672 Cerebral atherosclerosis: Secondary | ICD-10-CM | POA: Diagnosis not present

## 2014-10-14 DIAGNOSIS — Z8673 Personal history of transient ischemic attack (TIA), and cerebral infarction without residual deficits: Secondary | ICD-10-CM | POA: Diagnosis not present

## 2014-10-28 DIAGNOSIS — H40003 Preglaucoma, unspecified, bilateral: Secondary | ICD-10-CM | POA: Diagnosis not present

## 2014-10-28 DIAGNOSIS — Z961 Presence of intraocular lens: Secondary | ICD-10-CM | POA: Diagnosis not present

## 2015-01-13 DIAGNOSIS — C61 Malignant neoplasm of prostate: Secondary | ICD-10-CM | POA: Diagnosis not present

## 2015-01-13 DIAGNOSIS — N390 Urinary tract infection, site not specified: Secondary | ICD-10-CM | POA: Diagnosis not present

## 2015-01-13 DIAGNOSIS — R339 Retention of urine, unspecified: Secondary | ICD-10-CM | POA: Diagnosis not present

## 2015-01-13 DIAGNOSIS — N3289 Other specified disorders of bladder: Secondary | ICD-10-CM | POA: Diagnosis not present

## 2015-01-20 DIAGNOSIS — K6289 Other specified diseases of anus and rectum: Secondary | ICD-10-CM | POA: Diagnosis not present

## 2015-01-27 DIAGNOSIS — K6289 Other specified diseases of anus and rectum: Secondary | ICD-10-CM | POA: Diagnosis not present

## 2015-02-15 DIAGNOSIS — Z8546 Personal history of malignant neoplasm of prostate: Secondary | ICD-10-CM | POA: Diagnosis not present

## 2015-02-15 DIAGNOSIS — Z8547 Personal history of malignant neoplasm of testis: Secondary | ICD-10-CM | POA: Diagnosis not present

## 2015-02-15 DIAGNOSIS — I700 Atherosclerosis of aorta: Secondary | ICD-10-CM | POA: Diagnosis not present

## 2015-02-15 DIAGNOSIS — K922 Gastrointestinal hemorrhage, unspecified: Secondary | ICD-10-CM | POA: Diagnosis not present

## 2015-02-15 DIAGNOSIS — Z8673 Personal history of transient ischemic attack (TIA), and cerebral infarction without residual deficits: Secondary | ICD-10-CM | POA: Diagnosis not present

## 2015-02-15 DIAGNOSIS — Z79899 Other long term (current) drug therapy: Secondary | ICD-10-CM | POA: Diagnosis not present

## 2015-02-15 DIAGNOSIS — I10 Essential (primary) hypertension: Secondary | ICD-10-CM | POA: Diagnosis not present

## 2015-02-15 DIAGNOSIS — K5730 Diverticulosis of large intestine without perforation or abscess without bleeding: Secondary | ICD-10-CM | POA: Diagnosis not present

## 2015-02-15 DIAGNOSIS — Z7901 Long term (current) use of anticoagulants: Secondary | ICD-10-CM | POA: Diagnosis not present

## 2015-02-17 DIAGNOSIS — K625 Hemorrhage of anus and rectum: Secondary | ICD-10-CM | POA: Diagnosis not present

## 2015-02-17 DIAGNOSIS — Z23 Encounter for immunization: Secondary | ICD-10-CM | POA: Diagnosis not present

## 2015-02-18 DIAGNOSIS — K629 Disease of anus and rectum, unspecified: Secondary | ICD-10-CM | POA: Diagnosis not present

## 2015-03-10 DIAGNOSIS — Z23 Encounter for immunization: Secondary | ICD-10-CM | POA: Diagnosis not present

## 2015-03-24 DIAGNOSIS — K921 Melena: Secondary | ICD-10-CM | POA: Diagnosis not present

## 2015-03-24 DIAGNOSIS — K629 Disease of anus and rectum, unspecified: Secondary | ICD-10-CM | POA: Diagnosis not present

## 2015-04-03 DIAGNOSIS — E785 Hyperlipidemia, unspecified: Secondary | ICD-10-CM | POA: Diagnosis not present

## 2015-04-03 DIAGNOSIS — K921 Melena: Secondary | ICD-10-CM | POA: Diagnosis not present

## 2015-04-03 DIAGNOSIS — I10 Essential (primary) hypertension: Secondary | ICD-10-CM | POA: Diagnosis not present

## 2015-04-03 DIAGNOSIS — Z8546 Personal history of malignant neoplasm of prostate: Secondary | ICD-10-CM | POA: Diagnosis not present

## 2015-04-03 DIAGNOSIS — K5730 Diverticulosis of large intestine without perforation or abscess without bleeding: Secondary | ICD-10-CM | POA: Diagnosis not present

## 2015-05-12 DIAGNOSIS — E782 Mixed hyperlipidemia: Secondary | ICD-10-CM | POA: Diagnosis not present

## 2015-05-12 DIAGNOSIS — Z79899 Other long term (current) drug therapy: Secondary | ICD-10-CM | POA: Diagnosis not present

## 2015-05-12 DIAGNOSIS — I672 Cerebral atherosclerosis: Secondary | ICD-10-CM | POA: Diagnosis not present

## 2015-05-12 DIAGNOSIS — R8299 Other abnormal findings in urine: Secondary | ICD-10-CM | POA: Diagnosis not present

## 2015-05-12 DIAGNOSIS — Z8673 Personal history of transient ischemic attack (TIA), and cerebral infarction without residual deficits: Secondary | ICD-10-CM | POA: Diagnosis not present

## 2015-05-12 DIAGNOSIS — Z0000 Encounter for general adult medical examination without abnormal findings: Secondary | ICD-10-CM | POA: Diagnosis not present

## 2015-05-12 DIAGNOSIS — I482 Chronic atrial fibrillation: Secondary | ICD-10-CM | POA: Diagnosis not present

## 2015-05-12 DIAGNOSIS — K6289 Other specified diseases of anus and rectum: Secondary | ICD-10-CM | POA: Diagnosis not present

## 2015-05-19 DIAGNOSIS — H40003 Preglaucoma, unspecified, bilateral: Secondary | ICD-10-CM | POA: Diagnosis not present

## 2015-05-19 DIAGNOSIS — H524 Presbyopia: Secondary | ICD-10-CM | POA: Diagnosis not present

## 2015-05-22 DIAGNOSIS — J111 Influenza due to unidentified influenza virus with other respiratory manifestations: Secondary | ICD-10-CM | POA: Diagnosis not present

## 2015-05-22 DIAGNOSIS — R05 Cough: Secondary | ICD-10-CM | POA: Diagnosis not present

## 2015-07-21 DIAGNOSIS — C61 Malignant neoplasm of prostate: Secondary | ICD-10-CM | POA: Diagnosis not present

## 2015-09-22 DIAGNOSIS — C61 Malignant neoplasm of prostate: Secondary | ICD-10-CM | POA: Diagnosis not present

## 2015-09-22 DIAGNOSIS — N390 Urinary tract infection, site not specified: Secondary | ICD-10-CM | POA: Diagnosis not present

## 2015-09-22 DIAGNOSIS — R339 Retention of urine, unspecified: Secondary | ICD-10-CM | POA: Diagnosis not present

## 2015-09-22 DIAGNOSIS — N3090 Cystitis, unspecified without hematuria: Secondary | ICD-10-CM | POA: Diagnosis not present

## 2015-09-29 DIAGNOSIS — N390 Urinary tract infection, site not specified: Secondary | ICD-10-CM | POA: Diagnosis not present

## 2015-09-29 DIAGNOSIS — I482 Chronic atrial fibrillation: Secondary | ICD-10-CM | POA: Diagnosis not present

## 2015-09-29 DIAGNOSIS — I4891 Unspecified atrial fibrillation: Secondary | ICD-10-CM | POA: Diagnosis not present

## 2015-09-29 DIAGNOSIS — R079 Chest pain, unspecified: Secondary | ICD-10-CM | POA: Diagnosis not present

## 2015-09-29 DIAGNOSIS — A499 Bacterial infection, unspecified: Secondary | ICD-10-CM | POA: Diagnosis not present

## 2015-12-01 DIAGNOSIS — H40003 Preglaucoma, unspecified, bilateral: Secondary | ICD-10-CM | POA: Diagnosis not present

## 2016-01-12 DIAGNOSIS — Z8673 Personal history of transient ischemic attack (TIA), and cerebral infarction without residual deficits: Secondary | ICD-10-CM | POA: Diagnosis not present

## 2016-01-12 DIAGNOSIS — I672 Cerebral atherosclerosis: Secondary | ICD-10-CM | POA: Diagnosis not present

## 2016-01-12 DIAGNOSIS — Z79899 Other long term (current) drug therapy: Secondary | ICD-10-CM | POA: Diagnosis not present

## 2016-01-12 DIAGNOSIS — I482 Chronic atrial fibrillation: Secondary | ICD-10-CM | POA: Diagnosis not present

## 2016-01-12 DIAGNOSIS — E782 Mixed hyperlipidemia: Secondary | ICD-10-CM | POA: Diagnosis not present

## 2016-01-19 DIAGNOSIS — C61 Malignant neoplasm of prostate: Secondary | ICD-10-CM | POA: Diagnosis not present

## 2016-01-19 DIAGNOSIS — R339 Retention of urine, unspecified: Secondary | ICD-10-CM | POA: Diagnosis not present

## 2016-02-23 DIAGNOSIS — J209 Acute bronchitis, unspecified: Secondary | ICD-10-CM | POA: Diagnosis not present

## 2016-02-23 DIAGNOSIS — J0100 Acute maxillary sinusitis, unspecified: Secondary | ICD-10-CM | POA: Diagnosis not present

## 2016-03-08 DIAGNOSIS — Z23 Encounter for immunization: Secondary | ICD-10-CM | POA: Diagnosis not present

## 2016-03-09 DIAGNOSIS — Z23 Encounter for immunization: Secondary | ICD-10-CM | POA: Diagnosis not present

## 2016-05-03 DIAGNOSIS — H6123 Impacted cerumen, bilateral: Secondary | ICD-10-CM | POA: Diagnosis not present

## 2016-05-17 DIAGNOSIS — I482 Chronic atrial fibrillation: Secondary | ICD-10-CM | POA: Diagnosis not present

## 2016-05-17 DIAGNOSIS — E782 Mixed hyperlipidemia: Secondary | ICD-10-CM | POA: Diagnosis not present

## 2016-05-17 DIAGNOSIS — R8299 Other abnormal findings in urine: Secondary | ICD-10-CM | POA: Diagnosis not present

## 2016-05-17 DIAGNOSIS — I672 Cerebral atherosclerosis: Secondary | ICD-10-CM | POA: Diagnosis not present

## 2016-05-17 DIAGNOSIS — Z8673 Personal history of transient ischemic attack (TIA), and cerebral infarction without residual deficits: Secondary | ICD-10-CM | POA: Diagnosis not present

## 2016-05-17 DIAGNOSIS — Z79899 Other long term (current) drug therapy: Secondary | ICD-10-CM | POA: Diagnosis not present

## 2016-05-17 DIAGNOSIS — J0100 Acute maxillary sinusitis, unspecified: Secondary | ICD-10-CM | POA: Diagnosis not present

## 2016-05-24 DIAGNOSIS — N3289 Other specified disorders of bladder: Secondary | ICD-10-CM | POA: Diagnosis not present

## 2016-05-24 DIAGNOSIS — R339 Retention of urine, unspecified: Secondary | ICD-10-CM | POA: Diagnosis not present

## 2016-05-24 DIAGNOSIS — C61 Malignant neoplasm of prostate: Secondary | ICD-10-CM | POA: Diagnosis not present

## 2016-06-14 DIAGNOSIS — J320 Chronic maxillary sinusitis: Secondary | ICD-10-CM | POA: Diagnosis not present

## 2016-06-21 DIAGNOSIS — H40053 Ocular hypertension, bilateral: Secondary | ICD-10-CM | POA: Diagnosis not present

## 2016-06-21 DIAGNOSIS — H5203 Hypermetropia, bilateral: Secondary | ICD-10-CM | POA: Diagnosis not present

## 2016-08-19 DIAGNOSIS — N390 Urinary tract infection, site not specified: Secondary | ICD-10-CM | POA: Diagnosis not present

## 2016-08-19 DIAGNOSIS — C61 Malignant neoplasm of prostate: Secondary | ICD-10-CM | POA: Diagnosis not present

## 2016-08-19 DIAGNOSIS — R339 Retention of urine, unspecified: Secondary | ICD-10-CM | POA: Diagnosis not present

## 2016-09-06 DIAGNOSIS — N401 Enlarged prostate with lower urinary tract symptoms: Secondary | ICD-10-CM | POA: Diagnosis not present

## 2016-09-06 DIAGNOSIS — N3090 Cystitis, unspecified without hematuria: Secondary | ICD-10-CM | POA: Diagnosis not present

## 2016-09-06 DIAGNOSIS — R339 Retention of urine, unspecified: Secondary | ICD-10-CM | POA: Diagnosis not present

## 2016-09-24 DIAGNOSIS — C61 Malignant neoplasm of prostate: Secondary | ICD-10-CM | POA: Diagnosis not present

## 2016-09-24 DIAGNOSIS — N3289 Other specified disorders of bladder: Secondary | ICD-10-CM | POA: Diagnosis not present

## 2016-09-24 DIAGNOSIS — R339 Retention of urine, unspecified: Secondary | ICD-10-CM | POA: Diagnosis not present

## 2016-10-25 DIAGNOSIS — I482 Chronic atrial fibrillation: Secondary | ICD-10-CM | POA: Diagnosis not present

## 2016-10-25 DIAGNOSIS — E782 Mixed hyperlipidemia: Secondary | ICD-10-CM | POA: Diagnosis not present

## 2016-10-25 DIAGNOSIS — I672 Cerebral atherosclerosis: Secondary | ICD-10-CM | POA: Diagnosis not present

## 2016-10-25 DIAGNOSIS — Z79899 Other long term (current) drug therapy: Secondary | ICD-10-CM | POA: Diagnosis not present

## 2016-11-08 DIAGNOSIS — C61 Malignant neoplasm of prostate: Secondary | ICD-10-CM | POA: Diagnosis not present

## 2016-11-08 DIAGNOSIS — R339 Retention of urine, unspecified: Secondary | ICD-10-CM | POA: Diagnosis not present

## 2017-01-27 DIAGNOSIS — M25562 Pain in left knee: Secondary | ICD-10-CM | POA: Diagnosis not present

## 2017-01-27 DIAGNOSIS — M25462 Effusion, left knee: Secondary | ICD-10-CM | POA: Diagnosis not present

## 2017-01-31 DIAGNOSIS — M1711 Unilateral primary osteoarthritis, right knee: Secondary | ICD-10-CM | POA: Diagnosis not present

## 2017-01-31 DIAGNOSIS — S83209A Unspecified tear of unspecified meniscus, current injury, unspecified knee, initial encounter: Secondary | ICD-10-CM | POA: Diagnosis not present

## 2017-02-04 DIAGNOSIS — S83209A Unspecified tear of unspecified meniscus, current injury, unspecified knee, initial encounter: Secondary | ICD-10-CM | POA: Diagnosis not present

## 2017-02-04 DIAGNOSIS — M23041 Cystic meniscus, anterior horn of lateral meniscus, right knee: Secondary | ICD-10-CM | POA: Diagnosis not present

## 2017-02-10 DIAGNOSIS — M1711 Unilateral primary osteoarthritis, right knee: Secondary | ICD-10-CM | POA: Diagnosis not present

## 2017-03-07 DIAGNOSIS — Z23 Encounter for immunization: Secondary | ICD-10-CM | POA: Diagnosis not present

## 2017-03-07 DIAGNOSIS — M1711 Unilateral primary osteoarthritis, right knee: Secondary | ICD-10-CM | POA: Diagnosis not present

## 2017-03-07 DIAGNOSIS — S83289A Other tear of lateral meniscus, current injury, unspecified knee, initial encounter: Secondary | ICD-10-CM | POA: Diagnosis not present

## 2017-04-25 DIAGNOSIS — R339 Retention of urine, unspecified: Secondary | ICD-10-CM | POA: Diagnosis not present

## 2017-04-25 DIAGNOSIS — N3289 Other specified disorders of bladder: Secondary | ICD-10-CM | POA: Diagnosis not present

## 2017-04-25 DIAGNOSIS — C61 Malignant neoplasm of prostate: Secondary | ICD-10-CM | POA: Diagnosis not present

## 2017-05-09 DIAGNOSIS — H6123 Impacted cerumen, bilateral: Secondary | ICD-10-CM | POA: Diagnosis not present

## 2017-05-17 DIAGNOSIS — H6121 Impacted cerumen, right ear: Secondary | ICD-10-CM | POA: Diagnosis not present

## 2017-05-23 DIAGNOSIS — Z8673 Personal history of transient ischemic attack (TIA), and cerebral infarction without residual deficits: Secondary | ICD-10-CM | POA: Diagnosis not present

## 2017-05-23 DIAGNOSIS — Z0000 Encounter for general adult medical examination without abnormal findings: Secondary | ICD-10-CM | POA: Diagnosis not present

## 2017-05-23 DIAGNOSIS — I482 Chronic atrial fibrillation: Secondary | ICD-10-CM | POA: Diagnosis not present

## 2017-05-23 DIAGNOSIS — I672 Cerebral atherosclerosis: Secondary | ICD-10-CM | POA: Diagnosis not present

## 2017-05-23 DIAGNOSIS — Z79899 Other long term (current) drug therapy: Secondary | ICD-10-CM | POA: Diagnosis not present

## 2017-06-27 DIAGNOSIS — H524 Presbyopia: Secondary | ICD-10-CM | POA: Diagnosis not present

## 2017-06-27 DIAGNOSIS — H40003 Preglaucoma, unspecified, bilateral: Secondary | ICD-10-CM | POA: Diagnosis not present

## 2017-06-27 DIAGNOSIS — H353131 Nonexudative age-related macular degeneration, bilateral, early dry stage: Secondary | ICD-10-CM | POA: Diagnosis not present

## 2017-10-25 DIAGNOSIS — N390 Urinary tract infection, site not specified: Secondary | ICD-10-CM | POA: Diagnosis not present

## 2017-10-25 DIAGNOSIS — C61 Malignant neoplasm of prostate: Secondary | ICD-10-CM | POA: Diagnosis not present

## 2017-10-25 DIAGNOSIS — R21 Rash and other nonspecific skin eruption: Secondary | ICD-10-CM | POA: Diagnosis not present

## 2017-11-07 DIAGNOSIS — I672 Cerebral atherosclerosis: Secondary | ICD-10-CM | POA: Diagnosis not present

## 2017-11-07 DIAGNOSIS — I482 Chronic atrial fibrillation: Secondary | ICD-10-CM | POA: Diagnosis not present

## 2017-11-07 DIAGNOSIS — Z79899 Other long term (current) drug therapy: Secondary | ICD-10-CM | POA: Diagnosis not present

## 2017-11-07 DIAGNOSIS — Z8673 Personal history of transient ischemic attack (TIA), and cerebral infarction without residual deficits: Secondary | ICD-10-CM | POA: Diagnosis not present

## 2017-11-07 DIAGNOSIS — E611 Iron deficiency: Secondary | ICD-10-CM | POA: Diagnosis not present

## 2017-11-07 DIAGNOSIS — E782 Mixed hyperlipidemia: Secondary | ICD-10-CM | POA: Diagnosis not present

## 2017-12-26 DIAGNOSIS — H40003 Preglaucoma, unspecified, bilateral: Secondary | ICD-10-CM | POA: Diagnosis not present

## 2018-03-08 DIAGNOSIS — Z23 Encounter for immunization: Secondary | ICD-10-CM | POA: Diagnosis not present

## 2018-05-01 DIAGNOSIS — R339 Retention of urine, unspecified: Secondary | ICD-10-CM | POA: Diagnosis not present

## 2018-05-24 DIAGNOSIS — M8580 Other specified disorders of bone density and structure, unspecified site: Secondary | ICD-10-CM | POA: Diagnosis not present

## 2018-05-24 DIAGNOSIS — E782 Mixed hyperlipidemia: Secondary | ICD-10-CM | POA: Diagnosis not present

## 2018-05-24 DIAGNOSIS — Z13820 Encounter for screening for osteoporosis: Secondary | ICD-10-CM | POA: Diagnosis not present

## 2018-05-24 DIAGNOSIS — Z8673 Personal history of transient ischemic attack (TIA), and cerebral infarction without residual deficits: Secondary | ICD-10-CM | POA: Diagnosis not present

## 2018-05-24 DIAGNOSIS — Z0000 Encounter for general adult medical examination without abnormal findings: Secondary | ICD-10-CM | POA: Diagnosis not present

## 2018-05-24 DIAGNOSIS — Z79899 Other long term (current) drug therapy: Secondary | ICD-10-CM | POA: Diagnosis not present

## 2018-06-15 DIAGNOSIS — R339 Retention of urine, unspecified: Secondary | ICD-10-CM | POA: Diagnosis not present

## 2018-06-19 DIAGNOSIS — H40053 Ocular hypertension, bilateral: Secondary | ICD-10-CM | POA: Diagnosis not present

## 2018-06-19 DIAGNOSIS — H353131 Nonexudative age-related macular degeneration, bilateral, early dry stage: Secondary | ICD-10-CM | POA: Diagnosis not present

## 2018-06-19 DIAGNOSIS — H5203 Hypermetropia, bilateral: Secondary | ICD-10-CM | POA: Diagnosis not present

## 2018-06-27 DIAGNOSIS — I700 Atherosclerosis of aorta: Secondary | ICD-10-CM | POA: Diagnosis not present

## 2018-06-27 DIAGNOSIS — R079 Chest pain, unspecified: Secondary | ICD-10-CM | POA: Diagnosis not present

## 2018-06-27 DIAGNOSIS — I25118 Atherosclerotic heart disease of native coronary artery with other forms of angina pectoris: Secondary | ICD-10-CM | POA: Diagnosis not present

## 2018-06-27 DIAGNOSIS — J9811 Atelectasis: Secondary | ICD-10-CM | POA: Diagnosis not present

## 2018-06-27 DIAGNOSIS — I712 Thoracic aortic aneurysm, without rupture: Secondary | ICD-10-CM | POA: Diagnosis not present

## 2018-06-27 DIAGNOSIS — R9431 Abnormal electrocardiogram [ECG] [EKG]: Secondary | ICD-10-CM | POA: Diagnosis not present

## 2018-06-27 DIAGNOSIS — R0902 Hypoxemia: Secondary | ICD-10-CM | POA: Diagnosis not present

## 2018-06-27 DIAGNOSIS — R05 Cough: Secondary | ICD-10-CM | POA: Diagnosis not present

## 2018-06-27 DIAGNOSIS — E785 Hyperlipidemia, unspecified: Secondary | ICD-10-CM | POA: Diagnosis not present

## 2018-06-27 DIAGNOSIS — J90 Pleural effusion, not elsewhere classified: Secondary | ICD-10-CM | POA: Diagnosis not present

## 2018-06-27 DIAGNOSIS — E871 Hypo-osmolality and hyponatremia: Secondary | ICD-10-CM | POA: Diagnosis not present

## 2018-06-27 DIAGNOSIS — R072 Precordial pain: Secondary | ICD-10-CM | POA: Diagnosis not present

## 2018-06-27 DIAGNOSIS — I482 Chronic atrial fibrillation, unspecified: Secondary | ICD-10-CM | POA: Diagnosis not present

## 2018-06-27 DIAGNOSIS — Z79899 Other long term (current) drug therapy: Secondary | ICD-10-CM | POA: Diagnosis not present

## 2018-06-27 DIAGNOSIS — I2510 Atherosclerotic heart disease of native coronary artery without angina pectoris: Secondary | ICD-10-CM | POA: Diagnosis not present

## 2018-06-27 DIAGNOSIS — R0789 Other chest pain: Secondary | ICD-10-CM | POA: Diagnosis not present

## 2018-06-28 DIAGNOSIS — E871 Hypo-osmolality and hyponatremia: Secondary | ICD-10-CM | POA: Diagnosis not present

## 2018-06-28 DIAGNOSIS — J90 Pleural effusion, not elsewhere classified: Secondary | ICD-10-CM | POA: Diagnosis not present

## 2018-06-28 DIAGNOSIS — I712 Thoracic aortic aneurysm, without rupture: Secondary | ICD-10-CM | POA: Diagnosis not present

## 2018-06-28 DIAGNOSIS — J181 Lobar pneumonia, unspecified organism: Secondary | ICD-10-CM | POA: Diagnosis not present

## 2018-06-28 DIAGNOSIS — E785 Hyperlipidemia, unspecified: Secondary | ICD-10-CM | POA: Diagnosis not present

## 2018-06-28 DIAGNOSIS — R079 Chest pain, unspecified: Secondary | ICD-10-CM | POA: Diagnosis not present

## 2018-06-28 DIAGNOSIS — D72829 Elevated white blood cell count, unspecified: Secondary | ICD-10-CM | POA: Diagnosis not present

## 2018-06-28 DIAGNOSIS — I4891 Unspecified atrial fibrillation: Secondary | ICD-10-CM | POA: Diagnosis not present

## 2018-06-28 DIAGNOSIS — R9431 Abnormal electrocardiogram [ECG] [EKG]: Secondary | ICD-10-CM | POA: Diagnosis not present

## 2018-06-28 DIAGNOSIS — R072 Precordial pain: Secondary | ICD-10-CM | POA: Diagnosis not present

## 2018-06-28 DIAGNOSIS — J9811 Atelectasis: Secondary | ICD-10-CM | POA: Diagnosis not present

## 2018-07-05 DIAGNOSIS — I712 Thoracic aortic aneurysm, without rupture: Secondary | ICD-10-CM | POA: Diagnosis not present

## 2018-07-05 DIAGNOSIS — R0789 Other chest pain: Secondary | ICD-10-CM | POA: Diagnosis not present

## 2018-07-11 DIAGNOSIS — Z6824 Body mass index (BMI) 24.0-24.9, adult: Secondary | ICD-10-CM | POA: Diagnosis not present

## 2018-07-11 DIAGNOSIS — I712 Thoracic aortic aneurysm, without rupture: Secondary | ICD-10-CM | POA: Diagnosis not present

## 2018-07-11 DIAGNOSIS — J189 Pneumonia, unspecified organism: Secondary | ICD-10-CM | POA: Diagnosis not present

## 2018-12-18 DIAGNOSIS — H40023 Open angle with borderline findings, high risk, bilateral: Secondary | ICD-10-CM | POA: Diagnosis not present

## 2018-12-21 DIAGNOSIS — I712 Thoracic aortic aneurysm, without rupture: Secondary | ICD-10-CM | POA: Diagnosis not present

## 2018-12-21 DIAGNOSIS — Z79899 Other long term (current) drug therapy: Secondary | ICD-10-CM | POA: Diagnosis not present

## 2018-12-21 DIAGNOSIS — J189 Pneumonia, unspecified organism: Secondary | ICD-10-CM | POA: Diagnosis not present

## 2018-12-21 DIAGNOSIS — E782 Mixed hyperlipidemia: Secondary | ICD-10-CM | POA: Diagnosis not present

## 2018-12-21 DIAGNOSIS — R0600 Dyspnea, unspecified: Secondary | ICD-10-CM | POA: Diagnosis not present

## 2018-12-21 DIAGNOSIS — E611 Iron deficiency: Secondary | ICD-10-CM | POA: Diagnosis not present

## 2018-12-21 DIAGNOSIS — I10 Essential (primary) hypertension: Secondary | ICD-10-CM | POA: Diagnosis not present

## 2019-03-03 DIAGNOSIS — Z23 Encounter for immunization: Secondary | ICD-10-CM | POA: Diagnosis not present

## 2019-05-28 DIAGNOSIS — Z79899 Other long term (current) drug therapy: Secondary | ICD-10-CM | POA: Diagnosis not present

## 2019-05-28 DIAGNOSIS — I482 Chronic atrial fibrillation, unspecified: Secondary | ICD-10-CM | POA: Diagnosis not present

## 2019-05-28 DIAGNOSIS — E782 Mixed hyperlipidemia: Secondary | ICD-10-CM | POA: Diagnosis not present

## 2019-05-28 DIAGNOSIS — I10 Essential (primary) hypertension: Secondary | ICD-10-CM | POA: Diagnosis not present

## 2019-05-28 DIAGNOSIS — Z0000 Encounter for general adult medical examination without abnormal findings: Secondary | ICD-10-CM | POA: Diagnosis not present

## 2019-06-18 DIAGNOSIS — H40023 Open angle with borderline findings, high risk, bilateral: Secondary | ICD-10-CM | POA: Diagnosis not present

## 2019-06-21 DIAGNOSIS — I2510 Atherosclerotic heart disease of native coronary artery without angina pectoris: Secondary | ICD-10-CM | POA: Diagnosis not present

## 2019-06-21 DIAGNOSIS — I712 Thoracic aortic aneurysm, without rupture: Secondary | ICD-10-CM | POA: Diagnosis not present

## 2019-06-21 DIAGNOSIS — I700 Atherosclerosis of aorta: Secondary | ICD-10-CM | POA: Diagnosis not present

## 2019-12-18 DIAGNOSIS — H40023 Open angle with borderline findings, high risk, bilateral: Secondary | ICD-10-CM | POA: Diagnosis not present

## 2020-01-01 DIAGNOSIS — I10 Essential (primary) hypertension: Secondary | ICD-10-CM | POA: Diagnosis not present

## 2020-01-01 DIAGNOSIS — Z79899 Other long term (current) drug therapy: Secondary | ICD-10-CM | POA: Diagnosis not present

## 2020-01-01 DIAGNOSIS — E785 Hyperlipidemia, unspecified: Secondary | ICD-10-CM | POA: Diagnosis not present

## 2020-01-01 DIAGNOSIS — I712 Thoracic aortic aneurysm, without rupture: Secondary | ICD-10-CM | POA: Diagnosis not present

## 2020-01-01 DIAGNOSIS — I482 Chronic atrial fibrillation, unspecified: Secondary | ICD-10-CM | POA: Diagnosis not present

## 2020-04-03 DIAGNOSIS — I361 Nonrheumatic tricuspid (valve) insufficiency: Secondary | ICD-10-CM

## 2020-04-03 DIAGNOSIS — I340 Nonrheumatic mitral (valve) insufficiency: Secondary | ICD-10-CM

## 2020-04-03 DIAGNOSIS — E785 Hyperlipidemia, unspecified: Secondary | ICD-10-CM | POA: Diagnosis not present

## 2020-04-03 DIAGNOSIS — I619 Nontraumatic intracerebral hemorrhage, unspecified: Secondary | ICD-10-CM | POA: Diagnosis not present

## 2020-04-03 DIAGNOSIS — R2689 Other abnormalities of gait and mobility: Secondary | ICD-10-CM | POA: Diagnosis not present

## 2020-04-03 DIAGNOSIS — I639 Cerebral infarction, unspecified: Secondary | ICD-10-CM | POA: Diagnosis not present

## 2020-04-03 DIAGNOSIS — Z8679 Personal history of other diseases of the circulatory system: Secondary | ICD-10-CM | POA: Diagnosis not present

## 2020-04-03 DIAGNOSIS — I10 Essential (primary) hypertension: Secondary | ICD-10-CM | POA: Diagnosis not present

## 2020-04-03 DIAGNOSIS — Z9181 History of falling: Secondary | ICD-10-CM | POA: Diagnosis not present

## 2020-04-03 DIAGNOSIS — Z8673 Personal history of transient ischemic attack (TIA), and cerebral infarction without residual deficits: Secondary | ICD-10-CM | POA: Diagnosis not present

## 2020-04-03 DIAGNOSIS — E119 Type 2 diabetes mellitus without complications: Secondary | ICD-10-CM | POA: Diagnosis not present

## 2020-04-03 DIAGNOSIS — R29701 NIHSS score 1: Secondary | ICD-10-CM | POA: Diagnosis not present

## 2020-04-03 DIAGNOSIS — N390 Urinary tract infection, site not specified: Secondary | ICD-10-CM | POA: Diagnosis not present

## 2020-04-03 DIAGNOSIS — Z7982 Long term (current) use of aspirin: Secondary | ICD-10-CM | POA: Diagnosis not present

## 2020-04-03 DIAGNOSIS — S01111A Laceration without foreign body of right eyelid and periocular area, initial encounter: Secondary | ICD-10-CM | POA: Diagnosis not present

## 2020-04-03 DIAGNOSIS — S06360A Traumatic hemorrhage of cerebrum, unspecified, without loss of consciousness, initial encounter: Secondary | ICD-10-CM | POA: Diagnosis not present

## 2020-04-03 DIAGNOSIS — Z043 Encounter for examination and observation following other accident: Secondary | ICD-10-CM | POA: Diagnosis not present

## 2020-04-03 DIAGNOSIS — R220 Localized swelling, mass and lump, head: Secondary | ICD-10-CM | POA: Diagnosis not present

## 2020-04-03 DIAGNOSIS — S0181XA Laceration without foreign body of other part of head, initial encounter: Secondary | ICD-10-CM | POA: Diagnosis not present

## 2020-04-04 DIAGNOSIS — I619 Nontraumatic intracerebral hemorrhage, unspecified: Secondary | ICD-10-CM | POA: Diagnosis not present

## 2020-04-04 DIAGNOSIS — R220 Localized swelling, mass and lump, head: Secondary | ICD-10-CM | POA: Diagnosis not present

## 2020-04-04 DIAGNOSIS — I639 Cerebral infarction, unspecified: Secondary | ICD-10-CM | POA: Diagnosis not present

## 2020-04-04 DIAGNOSIS — N390 Urinary tract infection, site not specified: Secondary | ICD-10-CM | POA: Diagnosis not present

## 2020-04-10 DIAGNOSIS — I639 Cerebral infarction, unspecified: Secondary | ICD-10-CM | POA: Diagnosis not present

## 2020-04-10 DIAGNOSIS — I482 Chronic atrial fibrillation, unspecified: Secondary | ICD-10-CM | POA: Diagnosis not present

## 2020-04-10 DIAGNOSIS — S0990XA Unspecified injury of head, initial encounter: Secondary | ICD-10-CM | POA: Diagnosis not present

## 2020-04-10 DIAGNOSIS — I6782 Cerebral ischemia: Secondary | ICD-10-CM | POA: Diagnosis not present

## 2020-04-10 DIAGNOSIS — Z7689 Persons encountering health services in other specified circumstances: Secondary | ICD-10-CM | POA: Diagnosis not present

## 2020-04-10 DIAGNOSIS — Z6823 Body mass index (BMI) 23.0-23.9, adult: Secondary | ICD-10-CM | POA: Diagnosis not present

## 2020-04-10 DIAGNOSIS — G319 Degenerative disease of nervous system, unspecified: Secondary | ICD-10-CM | POA: Diagnosis not present

## 2020-04-10 DIAGNOSIS — I619 Nontraumatic intracerebral hemorrhage, unspecified: Secondary | ICD-10-CM | POA: Diagnosis not present

## 2020-04-10 DIAGNOSIS — I669 Occlusion and stenosis of unspecified cerebral artery: Secondary | ICD-10-CM | POA: Diagnosis not present

## 2020-04-10 DIAGNOSIS — I6381 Other cerebral infarction due to occlusion or stenosis of small artery: Secondary | ICD-10-CM | POA: Diagnosis not present

## 2020-04-11 DIAGNOSIS — Z6823 Body mass index (BMI) 23.0-23.9, adult: Secondary | ICD-10-CM | POA: Diagnosis not present

## 2020-04-11 DIAGNOSIS — Z4802 Encounter for removal of sutures: Secondary | ICD-10-CM | POA: Diagnosis not present

## 2020-04-11 DIAGNOSIS — I482 Chronic atrial fibrillation, unspecified: Secondary | ICD-10-CM | POA: Diagnosis not present

## 2020-04-11 DIAGNOSIS — I669 Occlusion and stenosis of unspecified cerebral artery: Secondary | ICD-10-CM | POA: Diagnosis not present

## 2020-05-05 DIAGNOSIS — I619 Nontraumatic intracerebral hemorrhage, unspecified: Secondary | ICD-10-CM | POA: Diagnosis not present

## 2020-05-05 DIAGNOSIS — I482 Chronic atrial fibrillation, unspecified: Secondary | ICD-10-CM | POA: Diagnosis not present

## 2020-05-05 DIAGNOSIS — Z6823 Body mass index (BMI) 23.0-23.9, adult: Secondary | ICD-10-CM | POA: Diagnosis not present

## 2020-05-07 ENCOUNTER — Emergency Department (HOSPITAL_COMMUNITY): Payer: Medicare Other

## 2020-05-07 ENCOUNTER — Inpatient Hospital Stay (HOSPITAL_COMMUNITY): Payer: Medicare Other

## 2020-05-07 ENCOUNTER — Inpatient Hospital Stay (HOSPITAL_COMMUNITY)
Admission: EM | Admit: 2020-05-07 | Discharge: 2020-05-10 | DRG: 064 | Disposition: A | Discharge status: Hospice | Payer: Medicare Other | Attending: Internal Medicine | Admitting: Internal Medicine

## 2020-05-07 DIAGNOSIS — H01006 Unspecified blepharitis left eye, unspecified eyelid: Secondary | ICD-10-CM | POA: Diagnosis not present

## 2020-05-07 DIAGNOSIS — I4891 Unspecified atrial fibrillation: Secondary | ICD-10-CM | POA: Diagnosis not present

## 2020-05-07 DIAGNOSIS — Z8547 Personal history of malignant neoplasm of testis: Secondary | ICD-10-CM

## 2020-05-07 DIAGNOSIS — I6389 Other cerebral infarction: Secondary | ICD-10-CM

## 2020-05-07 DIAGNOSIS — I611 Nontraumatic intracerebral hemorrhage in hemisphere, cortical: Secondary | ICD-10-CM | POA: Diagnosis present

## 2020-05-07 DIAGNOSIS — I639 Cerebral infarction, unspecified: Secondary | ICD-10-CM | POA: Diagnosis not present

## 2020-05-07 DIAGNOSIS — I6523 Occlusion and stenosis of bilateral carotid arteries: Secondary | ICD-10-CM | POA: Diagnosis not present

## 2020-05-07 DIAGNOSIS — R29709 NIHSS score 9: Secondary | ICD-10-CM | POA: Diagnosis not present

## 2020-05-07 DIAGNOSIS — Z7901 Long term (current) use of anticoagulants: Secondary | ICD-10-CM

## 2020-05-07 DIAGNOSIS — I6782 Cerebral ischemia: Secondary | ICD-10-CM | POA: Diagnosis not present

## 2020-05-07 DIAGNOSIS — Z8249 Family history of ischemic heart disease and other diseases of the circulatory system: Secondary | ICD-10-CM | POA: Diagnosis not present

## 2020-05-07 DIAGNOSIS — Z8673 Personal history of transient ischemic attack (TIA), and cerebral infarction without residual deficits: Secondary | ICD-10-CM

## 2020-05-07 DIAGNOSIS — I63411 Cerebral infarction due to embolism of right middle cerebral artery: Secondary | ICD-10-CM | POA: Diagnosis not present

## 2020-05-07 DIAGNOSIS — Z743 Need for continuous supervision: Secondary | ICD-10-CM | POA: Diagnosis not present

## 2020-05-07 DIAGNOSIS — I10 Essential (primary) hypertension: Secondary | ICD-10-CM

## 2020-05-07 DIAGNOSIS — Z515 Encounter for palliative care: Secondary | ICD-10-CM

## 2020-05-07 DIAGNOSIS — G9340 Encephalopathy, unspecified: Secondary | ICD-10-CM | POA: Diagnosis present

## 2020-05-07 DIAGNOSIS — H04129 Dry eye syndrome of unspecified lacrimal gland: Secondary | ICD-10-CM | POA: Diagnosis not present

## 2020-05-07 DIAGNOSIS — Z66 Do not resuscitate: Secondary | ICD-10-CM | POA: Diagnosis present

## 2020-05-07 DIAGNOSIS — G459 Transient cerebral ischemic attack, unspecified: Secondary | ICD-10-CM | POA: Diagnosis not present

## 2020-05-07 DIAGNOSIS — R402421 Glasgow coma scale score 9-12, in the field [EMT or ambulance]: Secondary | ICD-10-CM | POA: Diagnosis not present

## 2020-05-07 DIAGNOSIS — R0902 Hypoxemia: Secondary | ICD-10-CM | POA: Diagnosis not present

## 2020-05-07 DIAGNOSIS — I4821 Permanent atrial fibrillation: Secondary | ICD-10-CM | POA: Diagnosis not present

## 2020-05-07 DIAGNOSIS — M2550 Pain in unspecified joint: Secondary | ICD-10-CM | POA: Diagnosis not present

## 2020-05-07 DIAGNOSIS — Z7982 Long term (current) use of aspirin: Secondary | ICD-10-CM

## 2020-05-07 DIAGNOSIS — R471 Dysarthria and anarthria: Secondary | ICD-10-CM | POA: Diagnosis present

## 2020-05-07 DIAGNOSIS — Z20822 Contact with and (suspected) exposure to covid-19: Secondary | ICD-10-CM | POA: Diagnosis present

## 2020-05-07 DIAGNOSIS — R29810 Facial weakness: Secondary | ICD-10-CM | POA: Diagnosis present

## 2020-05-07 DIAGNOSIS — R29818 Other symptoms and signs involving the nervous system: Secondary | ICD-10-CM | POA: Diagnosis not present

## 2020-05-07 DIAGNOSIS — R410 Disorientation, unspecified: Secondary | ICD-10-CM | POA: Diagnosis not present

## 2020-05-07 DIAGNOSIS — R4182 Altered mental status, unspecified: Secondary | ICD-10-CM | POA: Diagnosis not present

## 2020-05-07 DIAGNOSIS — R29712 NIHSS score 12: Secondary | ICD-10-CM | POA: Diagnosis not present

## 2020-05-07 DIAGNOSIS — H1089 Other conjunctivitis: Secondary | ICD-10-CM | POA: Diagnosis not present

## 2020-05-07 DIAGNOSIS — I6501 Occlusion and stenosis of right vertebral artery: Secondary | ICD-10-CM | POA: Diagnosis not present

## 2020-05-07 DIAGNOSIS — R531 Weakness: Secondary | ICD-10-CM | POA: Diagnosis not present

## 2020-05-07 DIAGNOSIS — I618 Other nontraumatic intracerebral hemorrhage: Secondary | ICD-10-CM | POA: Diagnosis not present

## 2020-05-07 DIAGNOSIS — E785 Hyperlipidemia, unspecified: Secondary | ICD-10-CM | POA: Diagnosis present

## 2020-05-07 DIAGNOSIS — I619 Nontraumatic intracerebral hemorrhage, unspecified: Secondary | ICD-10-CM | POA: Diagnosis not present

## 2020-05-07 DIAGNOSIS — G8194 Hemiplegia, unspecified affecting left nondominant side: Secondary | ICD-10-CM | POA: Diagnosis present

## 2020-05-07 DIAGNOSIS — Z79899 Other long term (current) drug therapy: Secondary | ICD-10-CM

## 2020-05-07 DIAGNOSIS — R1310 Dysphagia, unspecified: Secondary | ICD-10-CM | POA: Diagnosis not present

## 2020-05-07 DIAGNOSIS — R0681 Apnea, not elsewhere classified: Secondary | ICD-10-CM | POA: Diagnosis not present

## 2020-05-07 DIAGNOSIS — Z8546 Personal history of malignant neoplasm of prostate: Secondary | ICD-10-CM

## 2020-05-07 DIAGNOSIS — R32 Unspecified urinary incontinence: Secondary | ICD-10-CM | POA: Diagnosis not present

## 2020-05-07 DIAGNOSIS — R4781 Slurred speech: Secondary | ICD-10-CM | POA: Diagnosis not present

## 2020-05-07 DIAGNOSIS — G319 Degenerative disease of nervous system, unspecified: Secondary | ICD-10-CM | POA: Diagnosis not present

## 2020-05-07 DIAGNOSIS — Z7401 Bed confinement status: Secondary | ICD-10-CM | POA: Diagnosis not present

## 2020-05-07 DIAGNOSIS — Z7189 Other specified counseling: Secondary | ICD-10-CM | POA: Diagnosis not present

## 2020-05-07 DIAGNOSIS — I482 Chronic atrial fibrillation, unspecified: Secondary | ICD-10-CM | POA: Diagnosis not present

## 2020-05-07 LAB — I-STAT CHEM 8, ED
BUN: 23 mg/dL (ref 8–23)
Calcium, Ion: 1.15 mmol/L (ref 1.15–1.40)
Chloride: 103 mmol/L (ref 98–111)
Creatinine, Ser: 1.3 mg/dL — ABNORMAL HIGH (ref 0.61–1.24)
Glucose, Bld: 158 mg/dL — ABNORMAL HIGH (ref 70–99)
HCT: 47 % (ref 39.0–52.0)
Hemoglobin: 16 g/dL (ref 13.0–17.0)
Potassium: 4.5 mmol/L (ref 3.5–5.1)
Sodium: 140 mmol/L (ref 135–145)
TCO2: 25 mmol/L (ref 22–32)

## 2020-05-07 LAB — COMPREHENSIVE METABOLIC PANEL
ALT: 31 U/L (ref 0–44)
AST: 33 U/L (ref 15–41)
Albumin: 3.8 g/dL (ref 3.5–5.0)
Alkaline Phosphatase: 60 U/L (ref 38–126)
Anion gap: 9 (ref 5–15)
BUN: 19 mg/dL (ref 8–23)
CO2: 26 mmol/L (ref 22–32)
Calcium: 9.3 mg/dL (ref 8.9–10.3)
Chloride: 104 mmol/L (ref 98–111)
Creatinine, Ser: 1.3 mg/dL — ABNORMAL HIGH (ref 0.61–1.24)
GFR, Estimated: 53 mL/min — ABNORMAL LOW (ref >60)
Glucose, Bld: 169 mg/dL — ABNORMAL HIGH (ref 70–99)
Potassium: 4.8 mmol/L (ref 3.5–5.1)
Sodium: 139 mmol/L (ref 135–145)
Total Bilirubin: 0.9 mg/dL (ref 0.3–1.2)
Total Protein: 7 g/dL (ref 6.5–8.1)

## 2020-05-07 LAB — ECHOCARDIOGRAM COMPLETE
Area-P 1/2: 4.49 cm2
Height: 71 in
S' Lateral: 3.2 cm
Weight: 2737.23 oz

## 2020-05-07 LAB — CBC WITH DIFFERENTIAL/PLATELET
Abs Immature Granulocytes: 0.02 10*3/uL (ref 0.00–0.07)
Basophils Absolute: 0 10*3/uL (ref 0.0–0.1)
Basophils Relative: 0 %
Eosinophils Absolute: 0.2 10*3/uL (ref 0.0–0.5)
Eosinophils Relative: 4 %
HCT: 46.1 % (ref 39.0–52.0)
Hemoglobin: 14.9 g/dL (ref 13.0–17.0)
Immature Granulocytes: 0 %
Lymphocytes Relative: 21 %
Lymphs Abs: 1.1 10*3/uL (ref 0.7–4.0)
MCH: 31.6 pg (ref 26.0–34.0)
MCHC: 32.3 g/dL (ref 30.0–36.0)
MCV: 97.9 fL (ref 80.0–100.0)
Monocytes Absolute: 0.8 10*3/uL (ref 0.1–1.0)
Monocytes Relative: 14 %
Neutro Abs: 3.2 10*3/uL (ref 1.7–7.7)
Neutrophils Relative %: 61 %
Platelets: 140 10*3/uL — ABNORMAL LOW (ref 150–400)
RBC: 4.71 MIL/uL (ref 4.22–5.81)
RDW: 12.6 % (ref 11.5–15.5)
WBC: 5.4 10*3/uL (ref 4.0–10.5)
nRBC: 0 % (ref 0.0–0.2)

## 2020-05-07 LAB — URINALYSIS, ROUTINE W REFLEX MICROSCOPIC
Bilirubin Urine: NEGATIVE
Glucose, UA: NEGATIVE mg/dL
Hgb urine dipstick: NEGATIVE
Ketones, ur: NEGATIVE mg/dL
Leukocytes,Ua: NEGATIVE
Nitrite: NEGATIVE
Protein, ur: NEGATIVE mg/dL
Specific Gravity, Urine: 1.024 (ref 1.005–1.030)
pH: 6 (ref 5.0–8.0)

## 2020-05-07 LAB — LIPID PANEL
Cholesterol: 88 mg/dL (ref 0–200)
HDL: 31 mg/dL — ABNORMAL LOW (ref >40)
LDL Cholesterol: 28 mg/dL (ref 0–99)
Total CHOL/HDL Ratio: 2.8 RATIO
Triglycerides: 145 mg/dL (ref <150)
VLDL: 29 mg/dL (ref 0–40)

## 2020-05-07 LAB — CBG MONITORING, ED: Glucose-Capillary: 148 mg/dL — ABNORMAL HIGH (ref 70–99)

## 2020-05-07 LAB — RESP PANEL BY RT-PCR (FLU A&B, COVID) ARPGX2
Influenza A by PCR: NEGATIVE
Influenza B by PCR: NEGATIVE
SARS Coronavirus 2 by RT PCR: NEGATIVE

## 2020-05-07 LAB — APTT: aPTT: 26 seconds (ref 24–36)

## 2020-05-07 LAB — RAPID URINE DRUG SCREEN, HOSP PERFORMED
Amphetamines: NOT DETECTED
Barbiturates: NOT DETECTED
Benzodiazepines: NOT DETECTED
Cocaine: NOT DETECTED
Opiates: NOT DETECTED
Tetrahydrocannabinol: NOT DETECTED

## 2020-05-07 LAB — PROTIME-INR
INR: 1 (ref 0.8–1.2)
Prothrombin Time: 13.1 seconds (ref 11.4–15.2)

## 2020-05-07 LAB — HEMOGLOBIN A1C
Hgb A1c MFr Bld: 6.5 % — ABNORMAL HIGH (ref 4.8–5.6)
Mean Plasma Glucose: 139.85 mg/dL

## 2020-05-07 LAB — ETHANOL: Alcohol, Ethyl (B): <10 mg/dL (ref <10)

## 2020-05-07 MED ORDER — OMEGA-3-ACID ETHYL ESTERS 1 G PO CAPS
1.0000 g | ORAL_CAPSULE | Freq: Every day | ORAL | Status: DC
  Filled 2020-05-07 (×2): qty 1

## 2020-05-07 MED ORDER — HEPARIN SODIUM (PORCINE) 5000 UNIT/ML IJ SOLN
5000.0000 [IU] | Freq: Three times a day (TID) | INTRAMUSCULAR | Status: DC
  Administered 2020-05-07 – 2020-05-10 (×8): 5000 [IU] via SUBCUTANEOUS
  Filled 2020-05-07 (×8): qty 1

## 2020-05-07 MED ORDER — DIGOXIN 125 MCG PO TABS: 0.1250 mg | ORAL_TABLET | ORAL | Status: DC

## 2020-05-07 MED ORDER — SIMVASTATIN 20 MG PO TABS: 40.0000 mg | ORAL_TABLET | Freq: Every day | ORAL | Status: DC

## 2020-05-07 MED ORDER — ASPIRIN 325 MG PO TABS: 325.0000 mg | ORAL_TABLET | Freq: Every day | ORAL | Status: DC

## 2020-05-07 MED ORDER — SODIUM CHLORIDE 0.9 % IV SOLN: INTRAVENOUS | Status: DC

## 2020-05-07 MED ORDER — SENNOSIDES-DOCUSATE SODIUM 8.6-50 MG PO TABS: 1.0000 | ORAL_TABLET | Freq: Every evening | ORAL | Status: DC | PRN

## 2020-05-07 MED ORDER — ACETAMINOPHEN 650 MG RE SUPP
650.0000 mg | RECTAL | Status: DC | PRN
  Administered 2020-05-08: 650 mg via RECTAL
  Filled 2020-05-07: qty 1

## 2020-05-07 MED ORDER — ASPIRIN 300 MG RE SUPP: 300.0000 mg | Freq: Every day | RECTAL | Status: DC

## 2020-05-07 MED ORDER — POLYVINYL ALCOHOL 1.4 % OP SOLN: 1.0000 [drp] | Freq: Every day | OPHTHALMIC | Status: DC | PRN

## 2020-05-07 MED ORDER — ACETAMINOPHEN 325 MG PO TABS: 650.0000 mg | ORAL_TABLET | ORAL | Status: DC | PRN

## 2020-05-07 MED ORDER — SIMVASTATIN 20 MG PO TABS
20.0000 mg | ORAL_TABLET | Freq: Every day | ORAL | Status: DC
  Administered 2020-05-09: 20 mg via ORAL
  Filled 2020-05-07: qty 1

## 2020-05-07 MED ORDER — STROKE: EARLY STAGES OF RECOVERY BOOK: Freq: Once | Status: DC

## 2020-05-07 MED ORDER — DIGOXIN 125 MCG PO TABS: 0.2500 mg | ORAL_TABLET | ORAL | Status: DC

## 2020-05-07 MED ORDER — LORATADINE 10 MG PO TABS
10.0000 mg | ORAL_TABLET | Freq: Every day | ORAL | Status: DC
  Filled 2020-05-07: qty 1

## 2020-05-07 MED ORDER — ASPIRIN EC 81 MG PO TBEC: 81.0000 mg | DELAYED_RELEASE_TABLET | Freq: Every day | ORAL | Status: DC

## 2020-05-07 MED ORDER — ENOXAPARIN SODIUM 40 MG/0.4ML ~~LOC~~ SOLN: 40.0000 mg | SUBCUTANEOUS | Status: DC

## 2020-05-07 MED ORDER — IOHEXOL 350 MG/ML SOLN
60.0000 mL | Freq: Once | INTRAVENOUS | Status: AC | PRN
  Administered 2020-05-07: 60 mL via INTRAVENOUS

## 2020-05-07 MED ORDER — ACETAMINOPHEN 160 MG/5ML PO SOLN: 650.0000 mg | ORAL | Status: DC | PRN

## 2020-05-07 NOTE — ED Triage Notes (Signed)
Pt from home with c/o left sided facial droop and confusion, pt LKN was at 2015 last night per family. Pt recently had a stroke with deficits of left sided weakness and right eye injury.    CBG 173 140/100 HR90 SATING 94 RA

## 2020-05-07 NOTE — Code Documentation (Signed)
Paged for code stroke from [place] around 2030 BP 147/104 HR 53 CBG 140's Previous stroke with right eye injury and left sided weakness  This am woke up with slurred speech, left sided gaze and left sided facial droop  CT and CTA negative  Thick secretions noted and patient reporting he can't breathe.   No interventions ordered at this time.

## 2020-05-07 NOTE — Progress Notes (Signed)
Echocardiogram 2D Echocardiogram has been performed.  Colton Bonilla 05/07/2020, 1:45 PM

## 2020-05-07 NOTE — Progress Notes (Signed)
Neurology Progress Note  Patient ID: Colton Bonilla is a 86 y.o. with PMHx of  has a past medical history of A-fib, Cancer, Prostate cancer, and Testicular cancer.  Initially consulted for: code stroke  Major interval events:  Nursing noted that the patient was acutely worse on neurological check.  Specifically he had been a bit agitated/delirious but then was unable to move his left arm or leg at all even with maximal noxious stimulation.  As I was in the middle of a code stroke at the time I asked them to bring the patient to scanner for stat head CT and then reevaluation by me in CT scan.  CT scan demonstrated new right parieto-occipital hypodensity with some hemorrhagic conversion, corresponding to the posterior stroke that I had suspected based on my evaluation yesterday.  On examination after scan, the patient's exam was found to be at baseline.  He was able to maintain the left arm and left leg antigravity without drift downwards for full count of 10.  He was able to follow simple commands.  Impression: Differential for his change in mental status at this point includes delirium or seizure.  At this time on further discussion with nursing there does not seem to have been any concern for any shaking movements etc.  Have asked nursing to continue to assess gaze and examination on neuro checks as ordered.  Patient has remained at baseline do not feel an emergent EEG is indicated but given hemorrhage he is at risk for seizure activity.  I will obtain an EEG tomorrow or more emergently overnight if more events occur.  Recommendations: - Order for routine EEG placed for tomorrow, will convert to emergent EEG if patient has more events concerning for potential seizures tonight - Holding aspirin for now given hemorrhagic conversion - DVT prophylaxis Lovenox changed to subcutaneous heparin as this is shorter acting in case bleeding worsens  Lesleigh Noe MD-PhD [facility] [phone]   No charge follow-up progress note

## 2020-05-07 NOTE — ED Notes (Signed)
Neurology at bedside.

## 2020-05-07 NOTE — ED Notes (Signed)
Patient transported to MRI 

## 2020-05-07 NOTE — ED Notes (Signed)
Pt has had changed in [place]. Neurologist has been paged.

## 2020-05-07 NOTE — ED Notes (Signed)
Pt gone to CT at this time.

## 2020-05-07 NOTE — Progress Notes (Addendum)
STROKE TEAM PROGRESS NOTE   INTERVAL HISTORY Wife and son are at the bedside. Pt lying in bed, mildly restless, keep asking for "go home". He has severe dysarthria, right gaze and barely cross midline. Mild left sided weakness, concerning a new stroke at right brain.   Per wife, pt had stroke in 2004 and found to have afib. Put on pradaxa, however, pt had LGIB vs. Hematuria after that, and he was taken off pradaxa and put on ASA. He had no stroke per wife since until 03/28/20 he hit his head on the right forehead because he did not see the object in his right visual field. He was in [facility] for right forehead skin laceration but was found to have left parietal moderate infarcts. He was put on low dose eliquis per wife but then his PCP put him back on ASA per wife. Wife did not notice any weakness, speech difficulty, numbness, etc.   However, last night, pt got up for bathroom the second time around 4am and he was not able to talk, only moaning, difficulty sitting in the toilet, wife helped him back to bed but found to have right facial droop. EMS called and he was sent to ER for evaluation.  Vitals:   05/07/20 1130 05/07/20 1200 05/07/20 1215 05/07/20 1230  BP: 125/87 (!) 126/110  (!) 152/90  Pulse: 88 86  84  Resp: 19 18  (!) 21  Temp:   98.2 F (36.8 C)   TempSrc:   Oral   SpO2: 95% 97%  99%  Weight:      Height:       CBC:  Recent Labs  Lab 05/07/20 0602 05/07/20 0607  WBC  --  5.4  NEUTROABS  --  3.2  HGB 16.0 14.9  HCT 47.0 46.1  MCV  --  97.9  PLT  --  696*   Basic Metabolic Panel:  Recent Labs  Lab 05/07/20 0531 05/07/20 0602  NA 139 140  K 4.8 4.5  CL 104 103  CO2 26  --   GLUCOSE 169* 158*  BUN 19 23  CREATININE 1.30* 1.30*  CALCIUM 9.3  --    Lipid Panel:  Recent Labs  Lab 05/07/20 0607  CHOL 88  TRIG 145  HDL 31*  CHOLHDL 2.8  VLDL 29  LDLCALC 28   HgbA1c:  Recent Labs  Lab 05/07/20 0607  HGBA1C 6.5*   Urine Drug Screen:  Recent  Labs  Lab 05/07/20 1115  LABOPIA NONE DETECTED  COCAINSCRNUR NONE DETECTED  LABBENZ NONE DETECTED  AMPHETMU NONE DETECTED  THCU NONE DETECTED  LABBARB NONE DETECTED    Alcohol Level  Recent Labs  Lab 05/07/20 0531  ETH <10    IMAGING past 24 hours CT Code Stroke CTA Head W/WO contrast  Result Date: 05/07/2020 CLINICAL DATA:  Sudden left-sided weakness. EXAM: CT ANGIOGRAPHY HEAD AND NECK CT PERFUSION BRAIN TECHNIQUE: Multidetector CT imaging of the head and neck was performed using the standard protocol during bolus administration of intravenous contrast. Multiplanar CT image reconstructions and MIPs were obtained to evaluate the vascular anatomy. Carotid stenosis measurements (when applicable) are obtained utilizing NASCET criteria, using the distal internal carotid diameter as the denominator. CONTRAST:  Dose is currently not known COMPARISON:  None. FINDINGS: CTA NECK FINDINGS Aortic arch: Atheromatous calcification.  Two vessel branching. Right carotid system: Atheromatous calcification at the bifurcation without flow limiting stenosis or ulceration. Left carotid system: Atheromatous calcification at the bifurcation without flow limiting stenosis  or ulceration. Vertebral arteries: Proximal subclavian atherosclerosis on both sides without flow limiting stenosis. Atheromatous plaque at both vertebral origins with moderate narrowing on the right. Both vertebrals are patent to the dura. Skeleton: No acute or aggressive finding. Other neck: No acute finding. Upper chest: No acute finding. Review of the MIP images confirms the above findings CTA HEAD FINDINGS Anterior circulation: Atheromatous calcification of bilateral carotid siphons. No emergent large vessel occlusion. No visible branch occlusion or flow limiting stenosis. There is generalized atheromatous irregularity the medium size vessels. Aplastic right A1 segment. Posterior circulation: Atheromatous calcification at both V4 segments up to  50% narrowing on the right. There is a high-grade proximal basilar stenosis due to right eccentric low-density plaque. Small distal basilar in the setting of fetal type bilateral PCA flow. Negative for aneurysm. Negative for major branch occlusion. Venous sinuses: Unremarkable Anatomic variants: As above Review of the MIP images confirms the above findings Delay and signing related to pending CT perfusion, which was subsequently canceled. IMPRESSION: 1. No emergent large vessel occlusion. 2. Intracranial atherosclerosis most notably affecting the posterior circulation where there is 50% right V4 and high-grade proximal basilar stenoses. 3. Cervical atherosclerosis without flow limiting stenosis. Electronically Signed   By: Monte Fantasia M.D.   On: 05/07/2020 06:22   CT Code Stroke CTA Neck W/WO contrast  Result Date: 05/07/2020 CLINICAL DATA:  Sudden left-sided weakness. EXAM: CT ANGIOGRAPHY HEAD AND NECK CT PERFUSION BRAIN TECHNIQUE: Multidetector CT imaging of the head and neck was performed using the standard protocol during bolus administration of intravenous contrast. Multiplanar CT image reconstructions and MIPs were obtained to evaluate the vascular anatomy. Carotid stenosis measurements (when applicable) are obtained utilizing NASCET criteria, using the distal internal carotid diameter as the denominator. CONTRAST:  Dose is currently not known COMPARISON:  None. FINDINGS: CTA NECK FINDINGS Aortic arch: Atheromatous calcification.  Two vessel branching. Right carotid system: Atheromatous calcification at the bifurcation without flow limiting stenosis or ulceration. Left carotid system: Atheromatous calcification at the bifurcation without flow limiting stenosis or ulceration. Vertebral arteries: Proximal subclavian atherosclerosis on both sides without flow limiting stenosis. Atheromatous plaque at both vertebral origins with moderate narrowing on the right. Both vertebrals are patent to the dura.  Skeleton: No acute or aggressive finding. Other neck: No acute finding. Upper chest: No acute finding. Review of the MIP images confirms the above findings CTA HEAD FINDINGS Anterior circulation: Atheromatous calcification of bilateral carotid siphons. No emergent large vessel occlusion. No visible branch occlusion or flow limiting stenosis. There is generalized atheromatous irregularity the medium size vessels. Aplastic right A1 segment. Posterior circulation: Atheromatous calcification at both V4 segments up to 50% narrowing on the right. There is a high-grade proximal basilar stenosis due to right eccentric low-density plaque. Small distal basilar in the setting of fetal type bilateral PCA flow. Negative for aneurysm. Negative for major branch occlusion. Venous sinuses: Unremarkable Anatomic variants: As above Review of the MIP images confirms the above findings Delay and signing related to pending CT perfusion, which was subsequently canceled. IMPRESSION: 1. No emergent large vessel occlusion. 2. Intracranial atherosclerosis most notably affecting the posterior circulation where there is 50% right V4 and high-grade proximal basilar stenoses. 3. Cervical atherosclerosis without flow limiting stenosis. Electronically Signed   By: Monte Fantasia M.D.   On: 05/07/2020 06:22   DG Chest Portable 1 View  Result Date: 05/07/2020 CLINICAL DATA:  Left facial droop, confusion EXAM: PORTABLE CHEST 1 VIEW COMPARISON:  06/27/2018 FINDINGS: Lungs are well expanded,  symmetric, and clear. No pneumothorax or pleural effusion. Left apical pleural calcification is unchanged, likely the sequela of remote trauma or inflammation. Cardiac size within normal limits. Pulmonary vascularity is normal. Osseous structures are age-appropriate. No acute bone abnormality. IMPRESSION: No active disease. Electronically Signed   By: Fidela Salisbury MD   On: 05/07/2020 06:47   CT HEAD CODE STROKE WO CONTRAST  Result Date:  05/07/2020 CLINICAL DATA:  Code stroke. Neuro deficit with stroke suspected. Left-sided facial droop and slurred speech EXAM: CT HEAD WITHOUT CONTRAST TECHNIQUE: Contiguous axial images were obtained from the base of the skull through the vertex without intravenous contrast. COMPARISON:  04/10/2020 at [facility] hospital FINDINGS: Brain: No evidence of acute infarction, hemorrhage, hydrocephalus, extra-axial collection or mass lesion/mass effect. Late subacute to chronic left parietooccipital infarct with patchy high-density from petechial hemorrhage by MRI. No visible progression from prior. Small remote right cerebellar infarct. Chronic small vessel ischemia that is confluent in the deep white matter. Generalized atrophy. Vascular: Atheromatous calcification. Skull: Normal. Negative for fracture or focal lesion. Sinuses/Orbits: Bilateral cataract resection Other: These results were communicated to Dr. Curly Sh[place] at [place] 12/1/2021by text page via the [facility] messaging system. ASPECTS [facility] Stroke Program Early CT Score) - Ganglionic level infarction (caudate, lentiform nuclei, internal capsule, insula, M1-M3 cortex): 7 - Supraganglionic infarction (M4-M6 cortex): 3 Total score (0-10 with 10 being normal): 10 IMPRESSION: 1. No acute finding. 2. Late subacute to chronic left parietooccipital infarct with petechial hemorrhage. 3. Chronic small vessel ischemia. Electronically Signed   By: Monte Fantasia M.D.   On: 05/07/2020 05:47    PHYSICAL EXAM  Temp:  [98.2 F (36.8 C)] 98.2 F (36.8 C) (12/01 1215) Pulse Rate:  [68-103] 102 (12/01 1420) Resp:  [14-21] 20 (12/01 1420) BP: (43-158)/(24-118) 158/102 (12/01 1420) SpO2:  [93 %-99 %] 94 % (12/01 1420) Weight:  [77.6 kg] 77.6 kg (12/01 0900)  General - Well nourished, well developed, lethargic and mildly restless.  Ophthalmologic - fundi not visualized due to noncooperation.  Cardiovascular - irregularly irregular heart rate and rhythm.  Neuro -  lethargic, barely open eyes on voice, severe dysarthria, orientated to people and place but not to time or age. Keep saying "OK" "I want to go home", but able to follow simple commands. Not cooperative on naming or repeating. Right gaze preference, barely cross midline. Not blinking to visual threat bilaterally. PERRL. Right facial droop. Tongue protrusion not cooperative. LUE drift with decreased finger grip, RUE 4/5 at least. BLE 3/5 proximal and 4/5 distally. Sensation subjectively symmetrical. FTN not cooperative. Gait not tested.   ASSESSMENT/PLAN Mr. Bawi Lakins is a 86 y.o. male with history of atrial fibrillation (unclear if on anticoagulation), hypertension, hyperlipidemia, coronary artery disease, prostate cancer presenting with  left facial droop, severe dysarthria, and potentially worsened left-sided weakness..   Stroke:  right brain infarct, embolic secondary to AF not on [facility]  Code Stroke CT head No acute abnormality. Subacute to chronic L parieto-occipital infarct w/ petechial hemorrhage. Small vessel disease. ASPECTS 10.     CTA head & neck no ELVO. RV4 50% stenosis. High-grade proximal BA stenosis. Cervical atherosclerosis   MRI  pending  2D Echo EF 55-60%  LDL 28  HgbA1c 6.5  VTE prophylaxis - Lovenox 40 mg sq daily   aspirin 81 mg daily prior to admission, now on ASA 300 PR. Will need to consider NOAC based on MRI finding  Therapy recommendations:  pending   Disposition:  pending   Atrial Fibrillation  Home anticoagulation:  none    hx LE bleeding from varicose vein in 2015, presented to ED. stable;   Per wife, pt had stroke in 2004 and found to have afib. Put on pradaxa, however, pt had LGIB vs. Hematuria after that, and he was taken off pradaxa and put on ASA. . Will rechallenge with NOAC based on MRI finding . Wife is in agreement   History of stroke  Per wife, stroke 2004, found to have A. fib, on Pradaxa, but later on stopped due to LGIB vs.  Hematuria  Recent stroke on 03/28/2020 with right hemianopia.  Admitted in [facility], details not clear.  However, by reviewing images,04/03/2020 CT head showed subacute infarct and petechial hemorrhage involving the left parietal lobe.  Same-day MRI showed 3 mm acute infarct within the right frontal lobe white matter.  Late subacute infarct within the left parietal lobe.  Chronic right frontal paramedian, and right basal ganglia lacunar infarcts.  Small chronic infarct within the right cerebellum.  Carotid Doppler negative.  Repeat CT 04/10/2020 showed unchanged late subacute left parietal infarct with cortical laminar necrosis.  Per wife, he was put on low dose eliquis but then his PCP put him back on ASA.  Hypertension  Stable . Permissive hypertension (OK if < 220/120) but gradually normalize in 3-5 days . Long-term BP goal normotensive  Hyperlipidemia  Home meds:  zocor 40 and fish oil, resumed in hospital  LDL 28, goal < 70  Decrease Zocor to 20  Continue statin at discharge  Dysphagia . Secondary to stroke . NPO . Put on IV fluid . Speech on board   Other Stroke Risk Factors  Advanced Age >/= 63   Other Active Problems  Hx prostate cancer  Hx testicular cancer  AAA 4.6 c5 in 06/2019, stable [facility] day # 0  Rosalin Hawking, MD PhD Stroke Neurology 05/07/2020 8:31 PM  I had long discussion with wife and son at bedside, updated pt current condition, treatment plan and potential prognosis, and answered all the questions.  They expressed understanding and appreciation.   To contact Stroke Continuity provider, please refer to [email or web address] After hours, contact General Neurology

## 2020-05-07 NOTE — ED Notes (Signed)
Waiting on speech eval for recommendations on PO meds/diet/aspiration risk.

## 2020-05-07 NOTE — ED Notes (Signed)
Patient was placed on bedpan, he felt like he was going to have a BM, but he didn't have to do anything.

## 2020-05-07 NOTE — H&P (Addendum)
Date: 05/07/2020               Patient Name:  Colton Bonilla MRN: [id number]  DOB: Jul 30, 1929 Age / Sex: 86 y.o., male   PCP: Myer Peer, MD         Medical Service: Internal Medicine Teaching Service         Attending Physician: Dr. Velna Ochs, MD    First Contact: Dr. Demaris Callander Pager: [phone]  Second Contact: Dr. Charleen Kirks Pager: [phone]       After Hours (After 5p/  First Contact Pager: [phone]  weekends / holidays): Second Contact Pager: [phone]   Chief Complaint: slurred speech  History of Present Illness: Colton Bonilla is an 86 y/o gentleman with history of chronic afib (not anticoagulated), HTN, prior CVA and multiple TIAs who presents with acute onset slurred speech. History was gathered from his wife, daughter and son-in-law. His wife notes they had a routine day yesterday and went to bed around 8:15 pm. At 12:30 this morning she helped him to the bathroom which is a normal occurrent about once or twice per night. She noted him to be normal then. At 4:00 this morning, she woke up to him making incomprehensible speech but assumed he needed to go to the bathroom again. It was at that point she found him to have left-sided facial droop. This along with his slurred speech prompted her to call EMS.  He was recently admitted at a little over a month ago for an an incident which he ran into a glass cabinet and sustained a laceration to his right eyebrow. During routine work-up he was found to have an acute CVA with evidence of old strokes as well. He did not have any significant deficits and was discharged with close PCP follow-up.He had a routine visit with PCP on 11/29, and wife was told everything was going well. During his hospitalization, she mentions the only medication change made was adjusting metoprolol to extended release.  Regarding his afib, he was previously on anticoagulation with Pradaxa, but had complication of GI bleeding and decision was made to discontinue.  This was approximately 5-6 years ago. Daughter notes that most recent hospitalization, they contemplated resuming anticoagulation, but with the head/facial trauma that brought him in and chronic issues with balance they elected to continue with aspirin alone.   Denies recent illness, falls, syncopal episodes, headaches, vision changes, decreased appetite, chest pain, shortness of breath, abdominal pain, n/v, changes in bowel or bladder habits, focal weakness.   Colton Bonilla lives at home with his wife. He uses a cane to get around. She assists him at night when using the bathroom to reduce fall risk. They are able to function and take care of each on their own at home. He was instructed not to drive any more after his last hospitalization which has been a difficult adjustment for him. Wife manages medications and is very thorough. His daughter lives approximately 30 minutes away.   Meds:  Current Meds  Medication Sig  . acetaminophen (TYLENOL) 500 MG tablet Take 500 mg by mouth every 6 (six) hours as needed for moderate pain.  . Ascorbic Acid (VITAMIN C) 1000 MG tablet Take 1,000 mg by mouth daily.  Marland Kitchen aspirin 81 MG EC tablet Take 81 mg by mouth daily.  . calcium carbonate (OSCAL) 1500 (600 Ca) MG TABS tablet Take 600 mg of elemental calcium by mouth daily.  . cetirizine (ZYRTEC) 10 MG tablet Take 10 mg by mouth  daily.  . Cholecalciferol (VITAMIN D) 50 MCG (2000 UT) CAPS Take 2,000 Units by mouth daily.  . digoxin (LANOXIN) 0.125 MG tablet Take 0.125-0.25 mg by mouth See admin instructions. Rotating 2 x 0.125mg = 0.25mg  taking one day, then 1 tab 0.125mg  next day  . metoprolol succinate (TOPROL-XL) 25 MG 24 hr tablet Take 25 mg by mouth daily.  . Multiple Vitamin (MULTIVITAMIN WITH MINERALS) TABS tablet Take 1 tablet by mouth daily.  . Omega-3 Fatty Acids (FISH OIL) 1000 MG CPDR Take 1,000 mg by mouth daily.  Marland Kitchen Propylene Glycol (SYSTANE BALANCE) 0.6 % SOLN Place 1 drop into both eyes daily as needed  (dry eyes).  . simvastatin (ZOCOR) 40 MG tablet Take 40 mg by mouth daily at 6 PM.     Allergies: Allergies as of 05/07/2020  . (No Known Allergies)   Past Medical History:  Diagnosis Date  . A-fib   . Cancer   . Prostate cancer   . Testicular cancer     Family History: Mom died of an MI, father had cancer but unsure which kind   Social History: previously worked as a Freight forwarder for [employer]. Never smoker, non-drinker, no illicit substance use   Review of Systems: A complete ROS was negative except as per HPI.   Physical Exam: Blood pressure (!) 154/105, pulse 94, resp. rate 14, SpO2 95 %. General: awake, disoriented, found out of bed mumbling  HEENT: [place]/AT, no scleral icterus, lips are dry, secretions in back of throat  CV: irregularly irregular, mildly tachycardic  Pulm: normal work of breathing; lungs CTA  Abd: soft, non-distended, non-tender Skin: warm and dry  Ext: no edema Neuro: dysarthric speech. Unable to evaluate visual fields due to mild disorientation. Noted to have right gaze preference, left facial droop. Motor strength is 5/5 on right, 4/5 on left. Sensation intact bilaterally. Unable to follow commands for cerebellar testing.   EKG: personally reviewed my interpretation is afib, no acute ischemic changes    Assessment & Plan by Problem: Active Problems:   CVA (cerebral vascular accident) [facility])  Colton Bonilla is an 86 y/o gentleman with history of chronic afib not on anticoagulation, HTN, prior CVA, multiple TIAs who presents with acute onset slurred speech and right gaze preference. LKN approximately 12:30 am on the morning of presentation. CT head showed late subacute to chronic left parietooccipital infact, CT angio negative for LVO. Lab work unremarkable. Unfortunately, MRI was unable to be completed due to patient not maintaining his secretions when lying flat. Given persistent deficits, findings are most consistent with acute CVA.   CVA -- appreciate  neurology following -- Allow for permissive HTN over the next 41-96 hours (systolic < 222 and diastolic < 979)  -- aspirin 325 mg daily; will likely need to resume anticoagulation if this is consistent with cardioembolic event  -- continue home statin therapy   -- Echocardiogram  -- A1C  -- Lipid panel  -- Tele monitoring  -- SLP eval -- PT/OT -- frequent neuro checks   Chronic afib -- continue home digoxin -- hold metoprolol in setting of allowing for permissive HTN -- as mentioned above, not currently anticoagulated due to history of GI bleed. Given that he has had multiple strokes since then, will need to discuss resuming anticoagulation. CHAD2VASc score is 6; HAS-BLED score is 4   Diet: NPO DVT ppx: Lovenox CODE STATUS: Confirmed with wife and daughter that patient wishes to be DNR   Dispo: Admit patient to Inpatient with expected length  of stay greater than 2 midnights.  SignedDelice Bison, DO 05/07/2020, 9:07 AM  Pager: [phone] After 5pm on weekdays and 1pm on weekends: On Call pager: [phone]

## 2020-05-07 NOTE — ED Notes (Signed)
Rounded on pt and wife. Updated on plans of care. Pt suctioned at this time.

## 2020-05-07 NOTE — ED Notes (Signed)
Patient returned from MRI. Patient unable to tolerate MRI due to inability to clear secretions from airway.

## 2020-05-07 NOTE — Progress Notes (Signed)
MRI attempted but pt begins choking on phlegm as soon as he lays flat. Pt sent back to room.

## 2020-05-07 NOTE — ED Notes (Signed)
Per Neurologist pt to CT for STAT CT. Neurologist assessed pt and scan was obtained.

## 2020-05-07 NOTE — Consult Note (Signed)
Neurology Consultation Reason for Consult: Left facial droop Requesting Physician: Dr. Velna Ochs  CC:   History is obtained from: chart review and EMS  HPI: Colton Bonilla is a 86 y.o. male with past medical history significant for atrial fibrillation (unclear if on anticoagulation), hypertension, hyperlipidemia, coronary artery disease, prostate cancer.  Limited records are available, patient's wife was not reachable, and EMS reported limited history could be provided by the family, including no medication list and no information about past medical history other than he had had a recent fall hitting his right eye and leading to a stroke which resulted in some left-sided weakness.  Reportedly this weakness is limited to the arm and leg.  He was last seen well at 8:15 PM.  Later in the morning he was yelling out "help, help" and was found in the bathroom unable to sit well on the toilet and peeing on the floor.  He was noted to have new left facial droop, severe dysarthria, and potentially worsened left-sided weakness.  LKW: Reportedly 8:15 PM, not personally confirmed  tPA given?: No, recent stroke, unclear if on anticoagulation for atrial fibrillation IA performed?: No, no LVO on CTA Premorbid modified rankin scale: Unknown  ROS: Unable to obtain due to altered mental status.   Past Medical History:  Diagnosis Date  . A-fib   . Cancer   . Prostate cancer   . Testicular cancer    Past Surgical History:  Procedure Laterality Date  . PROSTATE SURGERY    . TESTICLE REMOVAL Right    Current Outpatient Medications  Medication Instructions  . acetaminophen (TYLENOL) 500 mg, Every 6 hours PRN  . Bromfenac Sodium (PROLENSA) 0.07 % SOLN 1 drop, Every evening  . Fish Oil 1,000 mg, Daily  . metoprolol tartrate (LOPRESSOR) 6.25 mg, Daily PRN  . Multiple Vitamin (MULTIVITAMIN WITH MINERALS) TABS tablet 1 tablet, Daily  . Pradaxa 150 mg, 2 times daily  . simvastatin (ZOCOR) 40  mg, Daily-1800  . vitamin C 1,000 mg, Daily     Family History  Problem Relation Age of Onset  . Cancer Father     Social History:  reports that he has never smoked. He has never used smokeless tobacco. He reports that he does not drink alcohol and does not use drugs.   Exam: Current vital signs: BP (!) 154/113   Pulse 76   Resp 19   SpO2 94%  Vital signs in last 24 hours: Pulse Rate:  [76] 76 (12/01 0615) Resp:  [19] 19 (12/01 0615) BP: (154)/(113) 154/113 (12/01 0615) SpO2:  [94 %] 94 % (12/01 0615)   Physical Exam  Constitutional: Appears well-developed and well-nourished.  Psych: Affect appropriate to situation Eyes: No scleral injection HENT: No OP obstrucion MSK: no joint deformities.  Cardiovascular: Normal rate and regular rhythm.  Respiratory: Effort normal, non-labored breathing GI: Soft.  No distension. There is no tenderness.  Skin: WDI  Neuro: Mental Status: Patient is awake, alert, disoriented to person, place, month, year, and situation. Patient is able to give a clear and coherent history. No signs of aphasia or neglect Cranial Nerves: II: Visual Fields are notable for probably left visual field hemianopia. Pupils are unequal, L smaller than R, both round, and reactive to light.  III,IV, VI: EOMI with right gaze preference  V: Facial sensation is mildly assymetric to eyelash brush VII: Facial movement is notable for left facial droop, mild VIII: hearing is intact to voice X: Uvula elevates symmetrically XI: Shoulder shrug is  symmetric. XII: tongue is midline without atrophy or fasciculations.  Motor: Mild weakness of the left arm and leg compared to the right but able to maintain antigravity without drift  Sensory: Sensation is symmetric to light touch in the arms and legs Deep Tendon Reflexes: 2+ and symmetric in the biceps and patellae.  Plantars: Toes are upgoing bilaterally Cerebellar: FNF and HKS are dysmetric bilaterally   I have  reviewed labs in epic and the results pertinent to this consultation are: Cr 1.3 Platelets 140  I have reviewed the images obtained:  HCT with subacute / chronic left occpital parietal infarct w/ some associated hyperdensities CTA without LVO but significant posterior circulation findings which likely corresponds to his prior recent stroke  Impression:   Recommendations: - MRI brain  - If MRI positive, stroke workup  Lesleigh Noe MD-PhD [facility] [phone]   Addendum: Patient unable to tolerate MRI due to choking on his own secretions.  This is highly suspicious for dysphagia secondary to posterior circulation stroke.  Additional history is provided by his wife who arrived at bedside after my initial note was written.  She reports that he is not on anticoagulation for his atrial fibrillation secondary to GI bleed that he had many years ago.  He had presented to the emergency department but had not required blood transfusion to her recollection.  However he had been told to stop his anticoagulation and has not resumed it since.  Additionally he has been complaining of headache near where he had injured himself at the time of his most recent stroke.  She was able to provide a list of medications for me from her purse which did not include any anti-coagulant medication.  She reports he was last clearly well when they went to bed at 8:30 PM last night.  #Likely posterior fossa acute ischemic stroke, atheroembolic versus cardioembolic in the setting of atrial fibrillation not on anticoagulation  - Stroke labs HgbA1c, fasting lipid panel - MRI brain when able  - Frequent neuro checks - Echocardiogram - Prophylactic therapy-Antiplatelet med: Aspirin - dose 325mg  daily until anticoagulation is started (then ASA 81 mg may be continued if there is a strong cardiac indication such as CAD) - Risk factor modification - Telemetry monitoring; 30 day event monitor on discharge if no  arrythmias captured  - Blood pressure goal   - Permissive hypertension to 220/120 due to stenosis of posterior circulation for the next 24 hours at least  - PT consult, OT consult, Speech consult - Stroke team to follow

## 2020-05-07 NOTE — ED Provider Notes (Signed)
[place] EMERGENCY DEPARTMENT Provider Note   CSN: [id number] Arrival date & time: 05/07/20  7353  An emergency department physician performed an initial assessment on this suspected stroke patient at 60.  History Chief Complaint  Patient presents with  . Code Stroke    Colton Bonilla is a 86 y.o. male.  Apparently patient has had TIAs in the past but went to bed and in his normal state of health last night around 0800-0830.  This morning around 0400 he woke his wife up stating that he needed help.  She knows she had a left facial droop but would not look to the left.  She called EMS who brought him here for further evaluation.  Patient not able to offer history secondary to his dysarthria.  The history is provided by the spouse and the EMS personnel.  Neurologic Problem This is a new problem. The current episode started 1 to 2 hours ago. The problem occurs constantly. The problem has not changed since onset.Pertinent negatives include no chest pain, no abdominal pain, no headaches and no shortness of breath. Nothing aggravates the symptoms. Nothing relieves the symptoms. He has tried nothing for the symptoms.       Past Medical History:  Diagnosis Date  . A-fib   . Cancer   . Prostate cancer   . Testicular cancer     Patient Active Problem List   Diagnosis Date Noted  . Bleeding from varicose vein 01/29/2014  . A-fib ([place])   . Prostate cancer ([place])   . Dehydration 01/28/2014    Past Surgical History:  Procedure Laterality Date  . PROSTATE SURGERY    . TESTICLE REMOVAL Right        Family History  Problem Relation Age of Onset  . Cancer Father     Social History   Tobacco Use  . Smoking status: Never Smoker  . Smokeless tobacco: Never Used  Substance Use Topics  . Alcohol use: No  . Drug use: No    Home Medications Prior to Admission medications   Medication Sig Start Date End Date Taking? Authorizing Provider  acetaminophen  (TYLENOL) 500 MG tablet Take 500 mg by mouth every 6 (six) hours as needed for moderate pain.    [provider]  Ascorbic Acid (VITAMIN C) 1000 MG tablet Take 1,000 mg by mouth daily.    [provider]  Bromfenac Sodium (PROLENSA) 0.07 % SOLN Place 1 drop into the left eye every evening.    [provider]  metoprolol tartrate (LOPRESSOR) 25 MG tablet Take 6.25 mg by mouth daily as needed (for high BP).    [provider]  Multiple Vitamin (MULTIVITAMIN WITH MINERALS) TABS tablet Take 1 tablet by mouth daily.    [provider]  Omega-3 Fatty Acids (FISH OIL) 1000 MG CPDR Take 1,000 mg by mouth daily.    [provider]  PRADAXA 150 MG CAPS capsule Take 150 mg by mouth 2 (two) times daily. 01/02/14   [provider]  simvastatin (ZOCOR) 40 MG tablet Take 40 mg by mouth daily at 6 PM.    [provider]    Allergies    Patient has no known allergies.  Review of Systems   Review of Systems  Respiratory: Negative for shortness of breath.   Cardiovascular: Negative for chest pain.  Gastrointestinal: Negative for abdominal pain.  Neurological: Negative for headaches.  All other systems reviewed and are negative.   Physical Exam Updated Vital  Signs BP (!) 154/113   Pulse 76   Resp 19   SpO2 94%   Physical Exam Vitals and nursing note reviewed.  Constitutional:      Appearance: He is well-developed.  HENT:     Head: Normocephalic and atraumatic.     Nose: No congestion or rhinorrhea.     Mouth/Throat:     Mouth: Mucous membranes are moist.     Pharynx: Oropharynx is clear.  Eyes:     Comments: Will not look past midline to left  Cardiovascular:     Rate and Rhythm: Normal rate.  Pulmonary:     Effort: Pulmonary effort is normal. No respiratory distress.  Abdominal:     General: Abdomen is flat. There is no distension.  Musculoskeletal:        General: Normal range of motion.     Cervical back: Normal  range of motion.  Skin:    General: Skin is warm and dry.  Neurological:     Mental Status: He is alert.     Cranial Nerves: Cranial nerve deficit (left facial droop) present.     ED Results / Procedures / Treatments   Labs (all labs ordered are listed, but only abnormal results are displayed) Labs Reviewed  I-STAT CHEM 8, ED - Abnormal; Notable for the following components:      Result Value   Creatinine, Ser 1.30 (*)    Glucose, Bld 158 (*)    All other components within normal limits  CBG MONITORING, ED - Abnormal; Notable for the following components:   Glucose-Capillary 148 (*)    All other components within normal limits  RESP PANEL BY RT-PCR (FLU A&B, COVID) ARPGX2  PROTIME-INR  APTT  ETHANOL  COMPREHENSIVE METABOLIC PANEL  RAPID URINE DRUG SCREEN, HOSP PERFORMED  URINALYSIS, ROUTINE W REFLEX MICROSCOPIC  CBC WITH DIFFERENTIAL/PLATELET    EKG EKG Interpretation  Date/Time:  Wednesday May 07 2020 06:19:04 EST Ventricular Rate:  92 PR Interval:    QRS Duration: 93 QT Interval:  343 QTC Calculation: 425 R Axis:   -66 Text Interpretation: Atrial fibrillation Ventricular premature complex Left axis deviation Probable anterior infarct, age indeterminate slower rate, otherwise no significant change from 74 Confirmed by Merrily Pew [phone]) on 05/07/2020 6:29:17 AM   Radiology CT Code Stroke CTA Head W/WO contrast  Result Date: 05/07/2020 CLINICAL DATA:  Sudden left-sided weakness. EXAM: CT ANGIOGRAPHY HEAD AND NECK CT PERFUSION BRAIN TECHNIQUE: Multidetector CT imaging of the head and neck was performed using the standard protocol during bolus administration of intravenous contrast. Multiplanar CT image reconstructions and MIPs were obtained to evaluate the vascular anatomy. Carotid stenosis measurements (when applicable) are obtained utilizing NASCET criteria, using the distal internal carotid diameter as the denominator. CONTRAST:  Dose is currently not known  COMPARISON:  None. FINDINGS: CTA NECK FINDINGS Aortic arch: Atheromatous calcification.  Two vessel branching. Right carotid system: Atheromatous calcification at the bifurcation without flow limiting stenosis or ulceration. Left carotid system: Atheromatous calcification at the bifurcation without flow limiting stenosis or ulceration. Vertebral arteries: Proximal subclavian atherosclerosis on both sides without flow limiting stenosis. Atheromatous plaque at both vertebral origins with moderate narrowing on the right. Both vertebrals are patent to the dura. Skeleton: No acute or aggressive finding. Other neck: No acute finding. Upper chest: No acute finding. Review of the MIP images confirms the above findings CTA HEAD FINDINGS Anterior circulation: Atheromatous calcification of bilateral carotid siphons. No emergent large vessel occlusion. No visible branch occlusion or flow  limiting stenosis. There is generalized atheromatous irregularity the medium size vessels. Aplastic right A1 segment. Posterior circulation: Atheromatous calcification at both V4 segments up to 50% narrowing on the right. There is a high-grade proximal basilar stenosis due to right eccentric low-density plaque. Small distal basilar in the setting of fetal type bilateral PCA flow. Negative for aneurysm. Negative for major branch occlusion. Venous sinuses: Unremarkable Anatomic variants: As above Review of the MIP images confirms the above findings Delay and signing related to pending CT perfusion, which was subsequently canceled. IMPRESSION: 1. No emergent large vessel occlusion. 2. Intracranial atherosclerosis most notably affecting the posterior circulation where there is 50% right V4 and high-grade proximal basilar stenoses. 3. Cervical atherosclerosis without flow limiting stenosis. Electronically Signed   By: Monte Fantasia M.D.   On: 05/07/2020 06:22   CT Code Stroke CTA Neck W/WO contrast  Result Date: 05/07/2020 CLINICAL DATA:   Sudden left-sided weakness. EXAM: CT ANGIOGRAPHY HEAD AND NECK CT PERFUSION BRAIN TECHNIQUE: Multidetector CT imaging of the head and neck was performed using the standard protocol during bolus administration of intravenous contrast. Multiplanar CT image reconstructions and MIPs were obtained to evaluate the vascular anatomy. Carotid stenosis measurements (when applicable) are obtained utilizing NASCET criteria, using the distal internal carotid diameter as the denominator. CONTRAST:  Dose is currently not known COMPARISON:  None. FINDINGS: CTA NECK FINDINGS Aortic arch: Atheromatous calcification.  Two vessel branching. Right carotid system: Atheromatous calcification at the bifurcation without flow limiting stenosis or ulceration. Left carotid system: Atheromatous calcification at the bifurcation without flow limiting stenosis or ulceration. Vertebral arteries: Proximal subclavian atherosclerosis on both sides without flow limiting stenosis. Atheromatous plaque at both vertebral origins with moderate narrowing on the right. Both vertebrals are patent to the dura. Skeleton: No acute or aggressive finding. Other neck: No acute finding. Upper chest: No acute finding. Review of the MIP images confirms the above findings CTA HEAD FINDINGS Anterior circulation: Atheromatous calcification of bilateral carotid siphons. No emergent large vessel occlusion. No visible branch occlusion or flow limiting stenosis. There is generalized atheromatous irregularity the medium size vessels. Aplastic right A1 segment. Posterior circulation: Atheromatous calcification at both V4 segments up to 50% narrowing on the right. There is a high-grade proximal basilar stenosis due to right eccentric low-density plaque. Small distal basilar in the setting of fetal type bilateral PCA flow. Negative for aneurysm. Negative for major branch occlusion. Venous sinuses: Unremarkable Anatomic variants: As above Review of the MIP images confirms the  above findings Delay and signing related to pending CT perfusion, which was subsequently canceled. IMPRESSION: 1. No emergent large vessel occlusion. 2. Intracranial atherosclerosis most notably affecting the posterior circulation where there is 50% right V4 and high-grade proximal basilar stenoses. 3. Cervical atherosclerosis without flow limiting stenosis. Electronically Signed   By: Monte Fantasia M.D.   On: 05/07/2020 06:22   CT HEAD CODE STROKE WO CONTRAST  Result Date: 05/07/2020 CLINICAL DATA:  Code stroke. Neuro deficit with stroke suspected. Left-sided facial droop and slurred speech EXAM: CT HEAD WITHOUT CONTRAST TECHNIQUE: Contiguous axial images were obtained from the base of the skull through the vertex without intravenous contrast. COMPARISON:  [facility] FINDINGS: Brain: No evidence of acute infarction, hemorrhage, hydrocephalus, extra-axial collection or mass lesion/mass effect. Late subacute to chronic left parietooccipital infarct with patchy high-density from petechial hemorrhage by MRI. No visible progression from prior. Small remote right cerebellar infarct. Chronic small vessel ischemia that is confluent in the deep white matter. Generalized atrophy.  Vascular: Atheromatous calcification. Skull: Normal. Negative for fracture or focal lesion. Sinuses/Orbits: Bilateral cataract resection Other: These results were communicated to Dr. Curly Shores at [place] 12/1/2021by text page via the [facility] messaging system. ASPECTS [facility] Stroke Program Early CT Score) - Ganglionic level infarction (caudate, lentiform nuclei, internal capsule, insula, M1-M3 cortex): 7 - Supraganglionic infarction (M4-M6 cortex): 3 Total score (0-10 with 10 being normal): 10 IMPRESSION: 1. No acute finding. 2. Late subacute to chronic left parietooccipital infarct with petechial hemorrhage. 3. Chronic small vessel ischemia. Electronically Signed   By: Monte Fantasia M.D.   On: 05/07/2020 05:47     Procedures .Critical Care Performed by: Merrily Pew, MD Authorized by: Merrily Pew, MD   Critical care provider statement:    Critical care time (minutes):  45   Critical care was necessary to treat or prevent imminent or life-threatening deterioration of the following conditions:  CNS failure or compromise   Critical care was time spent personally by me on the following activities:  Discussions with consultants, evaluation of patient's response to treatment, examination of patient, ordering and performing treatments and interventions, ordering and review of laboratory studies, ordering and review of radiographic studies, pulse oximetry, re-evaluation of patient's condition, obtaining history from patient or surrogate and review of old charts   (including critical care time)  Medications Ordered in ED Medications  iohexol (OMNIPAQUE) 350 MG/ML injection 60 mL (60 mLs Intravenous Contrast Given 05/07/20 0609)    ED Course  I have reviewed the triage vital signs and the nursing notes.  Pertinent labs & imaging results that were available during my care of the patient were reviewed by me and considered in my medical decision making (see chart for details).    MDM Rules/Calculators/A&P                         Suspect stroke. Pending neurology recommendations.  Paged neurology No reply from neurologist, they have ordered an MRI.  Neuro note states: MRI --> if positive then stroke workup, if negative, eeg/epilepsy workup. Discussed with IM teaching service for admission.   Final Clinical Impression(s) / ED Diagnoses Final diagnoses:  Acute ischemic stroke [facility])    Rx / DC Orders ED Discharge Orders    None       Wandalee Klang, Corene Cornea, MD 05/07/20 2300

## 2020-05-08 ENCOUNTER — Inpatient Hospital Stay (HOSPITAL_COMMUNITY): Payer: Medicare Other

## 2020-05-08 DIAGNOSIS — I6389 Other cerebral infarction: Secondary | ICD-10-CM

## 2020-05-08 DIAGNOSIS — R1312 Dysphagia, oropharyngeal phase: Secondary | ICD-10-CM

## 2020-05-08 DIAGNOSIS — I63411 Cerebral infarction due to embolism of right middle cerebral artery: Principal | ICD-10-CM

## 2020-05-08 DIAGNOSIS — R4182 Altered mental status, unspecified: Secondary | ICD-10-CM

## 2020-05-08 LAB — BASIC METABOLIC PANEL
Anion gap: 13 (ref 5–15)
BUN: 17 mg/dL (ref 8–23)
CO2: 25 mmol/L (ref 22–32)
Calcium: 9.6 mg/dL (ref 8.9–10.3)
Chloride: 101 mmol/L (ref 98–111)
Creatinine, Ser: 1.04 mg/dL (ref 0.61–1.24)
GFR, Estimated: >60 mL/min (ref >60)
Glucose, Bld: 148 mg/dL — ABNORMAL HIGH (ref 70–99)
Potassium: 4 mmol/L (ref 3.5–5.1)
Sodium: 139 mmol/L (ref 135–145)

## 2020-05-08 LAB — CBC WITH DIFFERENTIAL/PLATELET
Abs Immature Granulocytes: 0.04 10*3/uL (ref 0.00–0.07)
Basophils Absolute: 0 10*3/uL (ref 0.0–0.1)
Basophils Relative: 0 %
Eosinophils Absolute: 0 10*3/uL (ref 0.0–0.5)
Eosinophils Relative: 0 %
HCT: 46.8 % (ref 39.0–52.0)
Hemoglobin: 15.8 g/dL (ref 13.0–17.0)
Immature Granulocytes: 0 %
Lymphocytes Relative: 10 %
Lymphs Abs: 1 10*3/uL (ref 0.7–4.0)
MCH: 31.6 pg (ref 26.0–34.0)
MCHC: 33.8 g/dL (ref 30.0–36.0)
MCV: 93.6 fL (ref 80.0–100.0)
Monocytes Absolute: 1.1 10*3/uL — ABNORMAL HIGH (ref 0.1–1.0)
Monocytes Relative: 11 %
Neutro Abs: 8 10*3/uL — ABNORMAL HIGH (ref 1.7–7.7)
Neutrophils Relative %: 79 %
Platelets: 150 10*3/uL (ref 150–400)
RBC: 5 MIL/uL (ref 4.22–5.81)
RDW: 12.5 % (ref 11.5–15.5)
WBC: 10.1 10*3/uL (ref 4.0–10.5)
nRBC: 0 % (ref 0.0–0.2)

## 2020-05-08 MED ORDER — DIGOXIN 0.25 MG/ML IJ SOLN
0.1250 mg | INTRAMUSCULAR | Status: DC
  Administered 2020-05-08: 0.125 mg via INTRAVENOUS
  Filled 2020-05-08: qty 2

## 2020-05-08 MED ORDER — ORAL CARE MOUTH RINSE
15.0000 mL | Freq: Two times a day (BID) | OROMUCOSAL | Status: DC
  Administered 2020-05-09 – 2020-05-10 (×3): 15 mL via OROMUCOSAL

## 2020-05-08 MED ORDER — LORAZEPAM 2 MG/ML IJ SOLN
0.5000 mg | Freq: Once | INTRAMUSCULAR | Status: AC | PRN
  Administered 2020-05-08: 0.5 mg via INTRAVENOUS
  Filled 2020-05-08: qty 1

## 2020-05-08 MED ORDER — DIGOXIN 0.25 MG/ML IJ SOLN
0.2500 mg | INTRAMUSCULAR | Status: DC
  Administered 2020-05-09: 0.25 mg via INTRAVENOUS
  Filled 2020-05-08: qty 2

## 2020-05-08 NOTE — Progress Notes (Signed)
°  Date: 05/08/2020  Patient name: Colton Bonilla  Medical record number: [id number]  Date of birth: 02-24-1930   I have seen and evaluated Colton Bonilla and discussed their care with the Residency Team. Briefly, Colton Bonilla is an 86 year old man with chronic Afib not on AC due to GI bleeding, HTN, prior CVA and TIA who presented with slurred speech and weakness.  He was found to have a stroke by CT scan of the head and MRI.  He was initially started on aspirin, but his weakness worsened and this has been held in the setting of petechial hemorrhaging on repeat imaging of the brain.  Work up for stroke is ongoing.   Vitals:   05/08/20 0754 05/08/20 1356  BP: 114/83 107/72  Pulse: (!) 105 93  Resp: (!) 23 18  Temp: 98.7 F (37.1 C)   SpO2: 100% 100%   General: Asleep, did not respond to voice, but would keep eyes closed and resist movement of his limbs on the right side.  Per family, he awoke to the sound of his son's voice today CV: Irreg Irreg, mildly tachycardic, no murmur, no peripheral edema Pulm: Breathing comfortably on room air Neuro: Right sided facial droop, unable to assess cranial nerves, left arm 0/5 strength.  Grip 5/5 on the right.  Otherwise unable to assess at the time of my meeting him.   Assessment and Plan: I have seen and evaluated the patient as outlined above. I agree with the formulated Assessment and Plan as detailed in the residents' note, with the following changes:   1. Acute CVA - Holding aspirin - Follow up neurology recommendations - TTE with normal EF, no apparent clot or endocarditis  - SLP/PT/OT - EEG today pending   Other issues per Dr. Debbe Bales daily note.   Sid Falcon, MD 12/2/20214:44 PM

## 2020-05-08 NOTE — Plan of Care (Signed)
  Problem: Clinical Measurements: Goal: Ability to maintain clinical measurements within normal limits will improve Outcome: Progressing Goal: Will remain free from infection Outcome: Progressing Goal: Diagnostic test results will improve Outcome: Progressing Goal: Respiratory complications will improve Outcome: Progressing Goal: Cardiovascular complication will be avoided Outcome: Progressing   Problem: Elimination: Goal: Will not experience complications related to bowel motility Outcome: Progressing Goal: Will not experience complications related to urinary retention Outcome: Progressing   Problem: Pain Managment: Goal: General experience of comfort will improve Outcome: Progressing   Problem: Skin Integrity: Goal: Risk for impaired skin integrity will decrease Outcome: Progressing   Problem: Clinical Measurements: Goal: Ability to maintain clinical measurements within normal limits will improve Outcome: Progressing Goal: Will remain free from infection Outcome: Progressing Goal: Diagnostic test results will improve Outcome: Progressing Goal: Respiratory complications will improve Outcome: Progressing Goal: Cardiovascular complication will be avoided Outcome: Progressing   Problem: Elimination: Goal: Will not experience complications related to bowel motility Outcome: Progressing Goal: Will not experience complications related to urinary retention Outcome: Progressing   Problem: Pain Managment: Goal: General experience of comfort will improve Outcome: Progressing   Problem: Skin Integrity: Goal: Risk for impaired skin integrity will decrease Outcome: Progressing   Problem: Education: Goal: Knowledge of General Education information will improve Description: Including pain rating scale, medication(s)/side effects and non-pharmacologic comfort measures Outcome: Not Progressing   Problem: Health Behavior/Discharge Planning: Goal: Ability to manage  health-related needs will improve Outcome: Not Progressing   Problem: Activity: Goal: Risk for activity intolerance will decrease Outcome: Not Progressing   Problem: Nutrition: Goal: Adequate nutrition will be maintained Outcome: Not Progressing   Problem: Coping: Goal: Level of anxiety will decrease Outcome: Not Progressing   Problem: Safety: Goal: Ability to remain free from injury will improve Outcome: Not Progressing   Problem: Education: Goal: Knowledge of General Education information will improve Description: Including pain rating scale, medication(s)/side effects and non-pharmacologic comfort measures Outcome: Not Progressing   Problem: Health Behavior/Discharge Planning: Goal: Ability to manage health-related needs will improve Outcome: Not Progressing   Problem: Activity: Goal: Risk for activity intolerance will decrease Outcome: Not Progressing   Problem: Nutrition: Goal: Adequate nutrition will be maintained Outcome: Not Progressing   Problem: Coping: Goal: Level of anxiety will decrease Outcome: Not Progressing   Problem: Safety: Goal: Ability to remain free from injury will improve Outcome: Not Progressing

## 2020-05-08 NOTE — Progress Notes (Signed)
Subjective: HD#1 Received Ativan to complete MRI Patient evaluated at bedside this AM. Colton Bonilla continues to be agitated and does not open his eyes or communicate with [place]. He continues to be agitated. His wife notes his status is unchanged from when she saw him yesterday with urinary incontinence. He continues to have snoring breathing. His wife endorses a lesion on his right forehead that he obtained after walking into a glass cabinet following his stroke.  Objective:  Vital signs in last 24 hours: Vitals:   05/08/20 0535 05/08/20 0600 05/08/20 0754 05/08/20 1356  BP:  107/90 114/83 107/72  Pulse:  96 (!) 105 93  Resp:  15 (!) 23 18  Temp:  97.9 F (36.6 C) 98.7 F (37.1 C)   TempSrc:  Oral Oral   SpO2: 98% 100% 100% 100%  Weight:      Height:       CBC Latest Ref Rng & Units 05/08/2020 05/07/2020 05/07/2020  WBC 4.0 - 10.5 K/uL 10.1 5.4 -  Hemoglobin 13.0 - 17.0 g/dL 15.8 14.9 16.0  Hematocrit 39 - 52 % 46.8 46.1 47.0  Platelets 150 - 400 K/uL 150 140(L) -   BMP Latest Ref Rng & Units 05/08/2020 05/07/2020 05/07/2020  Glucose 70 - 99 mg/dL 148(H) 158(H) 169(H)  BUN 8 - 23 mg/dL 17 23 19   Creatinine 0.61 - 1.24 mg/dL 1.04 1.30(H) 1.30(H)  Sodium 135 - 145 mmol/L 139 140 139  Potassium 3.5 - 5.1 mmol/L 4.0 4.5 4.8  Chloride 98 - 111 mmol/L 101 103 104  CO2 22 - 32 mmol/L 25 - 26  Calcium 8.9 - 10.3 mg/dL 9.6 - 9.3   Physical Exam General: Well developed, well nourished, somnolent, mumbles at times Cardio: Irregularly irregular rate and rhythm.  Lungs: CTAB Neuro: AAO*0 Extremities: No peripheral edema.   Assessment/Plan: Colton Bonilla is a 86 y.o. male with history of atrial fibrillation (not on anticoagulation), hypertension, hyperlipidemia, coronary artery disease, prostate cancer presenting with left facial droop, severe dysarthria, and potentially worsened left-sided weakness.  Active Problems:   CVA (cerebral vascular accident) [facility])  # Stroke #Right  Brain Infarct 2/2 AF # Acute Encephalopathy Patient presented with dysarthria, left facial droop. Left sided weakness. On initial CT scan it showed late subacute to chronic left parieto-occipital infarct with petechial hemorrhage. After code stroke, another CT scan shows petechial reperfusion hemorrhage. CTA head & neck shows RVA 50% stenosis. MRI Brain results are consistent with CT scan. Echo shows 55-60% EF. Due to ongoing concern for seizures EEG is done today and it does not suggest any epileptiform discharges.  Patient was somnolent this morning and were unable to wake him up. Was little agitated and trying to pull his clothes. Obtunded at this point. SLP evaluation did not happen as patient was obtunded and unable to participate in swallow/speech evaluation. Similarly PT/OT got cancelled. His HbA1c is 6.5%. Lipid panel is essentially normal, HDL 31.  -- Appreciate Neuro assistance. -- Hold aspirin 325 mg daily due to hemorrhagic conversion -- Tele monitoring  -- SLP eval -- frequent neuro checks  -- Heparin SubQ --SLP eval --PT/OT eval  # Chronic afib Patient has long history of Atrial fibrillation. He is not on anticoagulation due to past GI Bleeding, previously was on Pradaxa. Plan is to try to start anticoagulants during this admission as he had multiple strokes in last 6 months.    -- Change Po to IV Digoxin 0.125 mg every 48 hours and 0.25 mg every 48  hours.  # Hyperlipidemia At homer Zocor 40 mg, LDL 28, goal <70, decreasing Zocor from 40 to 20.  - Start Zocor 20 Prior to Admission Living Arrangement: Home Anticipated Discharge Location: Home Barriers to Discharge: Ongoing medical management Dispo: Anticipated discharge in approximately 3-4 day(s).   Honor Junes, MD 05/08/2020, 3:54 PM Pager: [phone] After 5pm on weekdays and 1pm on weekends: On Call pager [phone]

## 2020-05-08 NOTE — Progress Notes (Signed)
EEG complete - results pending 

## 2020-05-08 NOTE — Progress Notes (Signed)
Patient received ativan 0.5 mg and went to MRI with RN. Vital signs are stable. No event occurred. Patient came back to his room and vital signs are stable. Patient is sleeping and looks drowsy due to ativan.

## 2020-05-08 NOTE — Procedures (Signed)
Patient Name: Colton Bonilla  MRN: [id number]  Epilepsy Attending: Lora Havens  Referring Physician/Provider: Dr Lesleigh Noe Date: 05/08/2020 Duration: 25.35 mins  Patient history: 86 year old male with sudden worsening of mental status.  MRI brain showed infarct in posterolateral right frontal lobe and right frontal operculum as well as high left parietal lobe.  EEG to evaluate for seizures.  Level of alertness: Awake, asleep  AEDs during EEG study: None  Technical aspects: This EEG study was done with scalp electrodes positioned according to the 10-20 International system of electrode placement. Electrical activity was acquired at a sampling rate of 500Hz  and reviewed with a high frequency filter of 70Hz  and a low frequency filter of 1Hz . EEG data were recorded continuously and digitally stored.   Description: No clear posterior dominant rhythm was seen. Sleep was characterized by vertex waves, sleep spindles (12 to 14 Hz), maximal frontocentral region. EEG showed continuous generalized polymorphic predominantly 5 to 7 Hz theta slowing as well as intermittent generalized 2 to 3 Hz delta activity. There is also 15 to 18 Hz beta activity in bilateral frontocentral region. Hyperventilation and photic stimulation were not performed.     ABNORMALITY -Continuous slow, generalized  IMPRESSION: This study is suggestive of moderate diffuse encephalopathy, nonspecific etiology. No seizures or epileptiform discharges were seen throughout the recording.  Lianna Sitzmann Barbra Sarks

## 2020-05-08 NOTE — Progress Notes (Signed)
STROKE TEAM PROGRESS NOTE   INTERVAL HISTORY Wife at bedside.  Patient lying in bed, no agitation, able to open eyes slightly on voice, able to follow commands, still has left upper and lower extremity mild hemiparesis, however severe dysarthria and paucity of speech.  MRI confirmed right MCA stroke with petechial hemorrhagic conversion.  Vitals:   05/08/20 0400 05/08/20 0535 05/08/20 0600 05/08/20 0754  BP: 115/84  107/90 114/83  Pulse: (!) 104  96 (!) 105  Resp: 19  15 (!) 23  Temp: 97.9 F (36.6 C)  97.9 F (36.6 C) 98.7 F (37.1 C)  TempSrc: Oral  Oral Oral  SpO2: 99% 98% 100% 100%  Weight:      Height:       CBC:  Recent Labs  Lab 05/07/20 0607 05/08/20 0739  WBC 5.4 10.1  NEUTROABS 3.2 8.0*  HGB 14.9 15.8  HCT 46.1 46.8  MCV 97.9 93.6  PLT 140* 671   Basic Metabolic Panel:  Recent Labs  Lab 05/07/20 0531 05/07/20 0531 05/07/20 0602 05/08/20 0739  NA 139   < > 140 139  K 4.8   < > 4.5 4.0  CL 104   < > 103 101  CO2 26  --   --  25  GLUCOSE 169*   < > 158* 148*  BUN 19   < > 23 17  CREATININE 1.30*   < > 1.30* 1.04  CALCIUM 9.3  --   --  9.6   < > = values in this interval not displayed.   Lipid Panel:  Recent Labs  Lab 05/07/20 0607  CHOL 88  TRIG 145  HDL 31*  CHOLHDL 2.8  VLDL 29  LDLCALC 28   HgbA1c:  Recent Labs  Lab 05/07/20 0607  HGBA1C 6.5*   Urine Drug Screen:  Recent Labs  Lab 05/07/20 1115  LABOPIA NONE DETECTED  COCAINSCRNUR NONE DETECTED  LABBENZ NONE DETECTED  AMPHETMU NONE DETECTED  THCU NONE DETECTED  LABBARB NONE DETECTED    Alcohol Level  Recent Labs  Lab 05/07/20 0531  ETH <10    IMAGING past 24 hours CT HEAD WO CONTRAST  Result Date: 05/07/2020 CLINICAL DATA:  Stroke, follow-up EXAM: CT HEAD WITHOUT CONTRAST TECHNIQUE: Contiguous axial images were obtained from the base of the skull through the vertex without intravenous contrast. COMPARISON:  Earlier same day FINDINGS: Brain: There is hypoattenuation with  loss of gray-white differentiation involving the lateral right precentral gyrus. Superimposed gyriform hyperdensity. Late subacute to chronic left parieto-occipital infarct is again identified. There is no significant mass effect. Additional patchy and confluent hypoattenuation in the supratentorial white matter likely reflects stable chronic microvascular ischemic changes. Small chronic right cerebellar infarct. Ventricles are stable in size. Vascular: There is atherosclerotic calcification at the skull base. Skull: Calvarium is unremarkable. Sinuses/Orbits: No acute finding. Other: None. IMPRESSION: Evolving acute infarction of the lateral right precentral gyrus with petechial reperfusion hemorrhage. No significant mass effect. Otherwise stable appearance since earlier CT. These results were communicated to Dr. Curly Shores at 7:59 pm on 05/07/2020 by text page via the [facility] messaging system. Electronically Signed   By: Macy Mis M.D.   On: 05/07/2020 20:00   MR BRAIN WO CONTRAST  Result Date: 05/08/2020 CLINICAL DATA:  Neuro deficit, acute, stroke suspected. EXAM: MRI HEAD WITHOUT CONTRAST TECHNIQUE: Multiplanar, multiecho pulse sequences of the brain and surrounding structures were obtained without intravenous contrast. COMPARISON:  CT angiogram head/neck and non-contrast CT head examinations performed one day prior  05/07/2020 FINDINGS: Brain: The examination is intermittently motion degraded. Most notably, there is severe motion degradation of the axial T2 weighted sequence and moderate motion degradation of the axial SWI sequence. Mild cerebral and cerebellar atrophy. 4.1 x 3.3 x 5.5 cm region of restricted diffusion within the posterolateral right frontal lobe and right frontal operculum (including involvement of the lateral right precentral gyrus). Findings are compatible with acute infarction. Redemonstrated petechial hemorrhage within the infarction territory. No significant mass effect at this time. 5  mm acute cortical infarct within the high left parietal lobe (series 5, image 91). Unchanged late subacute to chronic cortical/subcortical infarct within the left parietooccipital lobes. As before, there is cortical laminar necrosis and/or nonacute blood products at this site. Unchanged small chronic cortically based infarct within the paramedian posterior right frontal lobe (series 12, image 22). Redemonstrated moderate multifocal T2/FLAIR hyperintensity within the cerebral white matter which is nonspecific, but compatible with chronic small vessel ischemic disease. This includes chronic lacunar infarcts within the bilateral cerebral white matter. Redemonstrated small chronic infarcts within the right cerebellar hemisphere. No evidence of intracranial mass. No extra-axial fluid collection. No midline shift. Vascular: Expected proximal arterial flow voids. Skull and upper cervical spine: No focal marrow lesion. Sinuses/Orbits: Visualized orbits show no acute finding. Trace ethmoid sinus mucosal thickening. IMPRESSION: Motion degraded exam. 4.1 x 3.3 x 5.5 cm cortically based infarct within the posterolateral right frontal lobe and right frontal operculum (this includes involvement of the lateral right precentral gyrus). Redemonstrated petechial hemorrhage at this site. No significant mass effect at this time. 5 mm acute cortical infarct within the high left parietal lobe. Redemonstrated late subacute to chronic left parietooccipital cortical/subcortical infarct with cortical laminar necrosis and/or non-acute blood products at this site. Otherwise stable MRI appearance of the brain as compared to 04/03/2020 with additional small chronic cortical and cerebellar infarcts, mild atrophy and moderate chronic small vessel ischemic disease. Electronically Signed   By: Kellie Simmering DO   On: 05/08/2020 07:50   ECHOCARDIOGRAM COMPLETE  Result Date: 05/07/2020    ECHOCARDIOGRAM REPORT   Patient Name:   Colton Bonilla  Date of Exam: 05/07/2020 Medical Rec #:  [id number]         Height:       71.0 in Accession #:    [id number]        Weight:       171.1 lb Date of Birth:  1929-08-18          BSA:          1.973 m Patient Age:    22 years          BP:           152/90 mmHg Patient Gender: M                 HR:           89 bpm. Exam Location:  Inpatient Procedure: 2D Echo, Color Doppler and Cardiac Doppler Indications:    Stroke i163.9  History:        Patient has prior history of Echocardiogram examinations, most                 recent 06/28/2018. CAD, Arrythmias:Atrial Fibrillation; Risk                 Factors:Hypertension and Dyslipidemia. Prior performed at [facility].  Sonographer:    Raquel Sarna Senior RDCS Referring Phys: [id number] JASON MESNER  Sonographer Comments: Scanned upright due to aspiration risk.  IMPRESSIONS  1. Left ventricular ejection fraction, by estimation, is 55 to 60%. The left ventricle has normal function. The left ventricle has no regional wall motion abnormalities. Left ventricular diastolic parameters are indeterminate.  2. Right ventricular systolic function is mildly reduced. The right ventricular size is normal. There is normal pulmonary artery systolic pressure.  3. Left atrial size was moderately dilated.  4. Right atrial size was mildly dilated.  5. Small, apical pericardial effusion without IVC dilation.  6. The mitral valve is grossly normal. No evidence of mitral valve regurgitation.  7. The aortic valve is tricuspid. Aortic valve regurgitation is trivial.  8. There is mild dilatation of the ascending aorta, measuring 37 mm. Comparison(s): No prior Echocardiogram. Conclusion(s)/Recommendation(s): Study significantly foreshortened because of upright imaging. FINDINGS  Left Ventricle: Left ventricular ejection fraction, by estimation, is 55 to 60%. The left ventricle has normal function. The left ventricle has no regional wall motion abnormalities. The left ventricular internal cavity size was normal in size.  There is  no left ventricular hypertrophy. Left ventricular diastolic parameters are indeterminate. Right Ventricle: The right ventricular size is normal. Right vetricular wall thickness was not well visualized. Right ventricular systolic function is mildly reduced. There is normal pulmonary artery systolic pressure. The tricuspid regurgitant velocity is 2.42 m/s, and with an assumed right atrial pressure of 3 mmHg, the estimated right ventricular systolic pressure is 27.2 mmHg. Left Atrium: Left atrial size was moderately dilated. Right Atrium: Right atrial size was mildly dilated. Pericardium: Small, apical pericardial effusion without IVC dilation. A small pericardial effusion is present. Mitral Valve: The mitral valve is grossly normal. No evidence of mitral valve regurgitation. Tricuspid Valve: The tricuspid valve is grossly normal. Tricuspid valve regurgitation is not demonstrated. Aortic Valve: The aortic valve is tricuspid. Aortic valve regurgitation is trivial. Pulmonic Valve: The pulmonic valve was not well visualized. Pulmonic valve regurgitation is not visualized. Aorta: The aortic root is normal in size and structure. There is mild dilatation of the ascending aorta, measuring 37 mm. Venous: The pulmonary veins were not well visualized. IAS/Shunts: The atrial septum is grossly normal.  LEFT VENTRICLE PLAX 2D LVIDd:         4.40 cm LVIDs:         3.20 cm LV PW:         1.00 cm LV IVS:        1.00 cm LVOT diam:     2.00 cm LV SV:         31 LV SV Index:   16 LVOT Area:     3.14 cm  RIGHT VENTRICLE RV S prime:     9.25 cm/s TAPSE (M-mode): 1.0 cm LEFT ATRIUM             Index       RIGHT ATRIUM           Index LA diam:        3.00 cm 1.52 cm/m  RA Area:     20.70 cm LA Vol (A2C):   72.4 ml 36.69 ml/m RA Volume:   63.10 ml  31.98 ml/m LA Vol (A4C):   84.8 ml 42.98 ml/m LA Biplane Vol: 86.2 ml 43.69 ml/m  AORTIC VALVE LVOT Vmax:   47.00 cm/s LVOT Vmean:  34.400 cm/s LVOT VTI:    0.098 m  AORTA Ao Root  diam: 3.50 cm Ao Asc diam:  3.70 cm MITRAL VALVE  TRICUSPID VALVE MV Area (PHT): 4.49 cm    TR Peak grad:   23.4 mmHg MV Decel Time: 169 msec    TR Vmax:        242.00 cm/s MV E velocity: 74.10 cm/s MV A velocity: 27.90 cm/s  SHUNTS MV E/A ratio:  2.66        Systemic VTI:  0.10 m                            Systemic Diam: 2.00 cm Rudean Haskell MD Electronically signed by Rudean Haskell MD Signature Date/Time: 05/07/2020/3:25:32 PM    Final     PHYSICAL EXAM  Temp:  [97.8 F (36.6 C)-98.7 F (37.1 C)] 98.7 F (37.1 C) (12/02 0754) Pulse Rate:  [84-120] 105 (12/02 0754) Resp:  [15-25] 23 (12/02 0754) BP: (107-159)/(74-116) 114/83 (12/02 0754) SpO2:  [93 %-100 %] 100 % (12/02 0754) Weight:  [68.7 kg] 68.7 kg (12/02 0000)  General - Well nourished, well developed, lethargic.  Ophthalmologic - fundi not visualized due to noncooperation.  Cardiovascular - irregularly irregular heart rate and rhythm with intermittent RVR.  Neuro - lethargic, slightly open eyes on voice, severe dysarthria and hypophonia, orientated to people and place but not to time or age. Able to follow simple commands. Not cooperative on naming or repeating. Right gaze preference, barely cross midline. Not blinking to visual threat bilaterally. PERRL. Right facial droop. Tongue protrusion not cooperative. LUE mild drift with decreased finger grip, RUE 4/5 at least. RLE 4/5 with knee flexion, LLE 3/5 proximal with knee flexion and 4/5 distally. Sensation subjectively symmetrical. FTN not cooperative. Gait not tested.   ASSESSMENT/PLAN Colton Bonilla is a 86 y.o. male with history of atrial fibrillation (unclear if on anticoagulation), hypertension, hyperlipidemia, coronary artery disease, prostate cancer presenting with  left facial droop, severe dysarthria, and potentially worsened left-sided weakness..   Stroke:  Moderate right frontal lobe infarcts and punctate L parietal lobe infarct,  embolic secondary to AF not on [facility]  Code Stroke CT head No acute abnormality. Subacute to chronic L parieto-occipital infarct w/ petechial hemorrhage. Small vessel disease. ASPECTS 10.     CTA head & neck no ELVO. RV4 50% stenosis. High-grade proximal BA stenosis. Cervical atherosclerosis   Repeat CT head w/ neuro worsening (reported agitated, delirious, unable to move L arm or leg; MD exam unchanged) - evolving lateral R precentral gyrus infarct w/ petechial hemorrhage    EEG  moderate diffuse encephalopathy  MRI w/ ativan  Posterior R frontal lobe and R frontal opercular infarct. High L parietal lobe punctate infarct. Chronic L parieto-occipital cortical/subcortical infarcts.   2D Echo EF 55-60%  LDL 28  HgbA1c 6.5  VTE prophylaxis - Lovenox 40 mg sq daily   aspirin 81 mg daily prior to admission, now on ASA 300 PR. Given size of infarct and petechial hemorrhage seen on CT, will consider NOAC in 10-14 days post stroke   Therapy recommendations:  pending   Disposition:  pending   Atrial Fibrillation w/ RVR  Home anticoagulation:  none    hx LE bleeding from varicose vein in 2015, presented to ED. stable;   Per wife, pt had stroke in 2004 and found to have afib. Put on pradaxa, however, pt had LGIB vs. Hematuria after that, and he was taken off pradaxa and put on ASA. . Will rechallenge with NOAC 10-14 days post stroke  . Wife is in agreement, wife does not want  pradaxa this time   History of stroke  Per wife, stroke 2004, found to have A. fib, on Pradaxa, but later on stopped due to LGIB vs. Hematuria  Recent stroke on 03/28/2020 with right hemianopia.  Admitted in [facility], details not clear.  However, by reviewing images,04/03/2020 CT head showed subacute infarct and petechial hemorrhage involving the left parietal lobe.  Same-day MRI showed 3 mm acute infarct within the right frontal lobe white matter.  Late subacute infarct within the left parietal lobe.  Chronic  right frontal paramedian, and right basal ganglia lacunar infarcts.  Small chronic infarct within the right cerebellum.  Carotid Doppler negative.  Repeat CT 04/10/2020 showed unchanged late subacute left parietal infarct with cortical laminar necrosis.  Per wife, he was put on low dose eliquis but then his PCP put him back on ASA due to petechial hemorrhage.  Hypertension  Stable on the low end . Permissive hypertension (OK if < 180/105 due to petechial hemorrahge) but gradually normalize in 3-5 days . Long-term BP goal normotensive  Hyperlipidemia  Home meds:  zocor 40 and fish oil, resumed in hospital  LDL 28, goal < 70  Decrease Zocor to 20 given low LDL (moderate/intensive statin not recommended)  Continue statin at discharge  Dysphagia . Secondary to stroke . NPO . Put on IV fluid . Speech on board . May consider cortrak in am   Other Stroke Risk Factors  Advanced Age >/= 40   Other Active Problems  Hx prostate cancer  Hx testicular cancer  AAA 4.6 at C5 in 06/2019, stable [facility] day # 1  I spent  35 minutes in total face-to-face time with the patient, more than 50% of which was spent in counseling and coordination of care, reviewing test results, images and medication, and discussing the diagnosis, treatment plan and potential prognosis. This patient's care requiresreview of multiple databases, neurological assessment, discussion with family, other specialists and medical decision making of high complexity. I had long discussion with wife at bedside, updated pt current condition, treatment plan and potential prognosis, and answered all the questions.  She expressed understanding and appreciation.    Rosalin Hawking, MD PhD Stroke Neurology 05/08/2020 11:08 AM  To contact Stroke Continuity provider, please refer to [email or web address] After hours, contact General Neurology

## 2020-05-08 NOTE — Progress Notes (Signed)
SLP Cancellation Note  Patient Details Name: Colton Bonilla MRN: [id number] DOB: 02-21-1930   Cancelled evaluation:    Pt obtunded and unable to participate in swallow/speech evaluations. Will follow along for readiness. D/W wife.  Colton Bonilla L. Colton Bonilla, Colton Bonilla        Colton Bonilla 05/08/2020, 10:47 AM

## 2020-05-08 NOTE — Progress Notes (Signed)
PT Cancellation Note  Patient Details Name: Colton Bonilla MRN: [id number] DOB: 1929/11/22   Cancelled Treatment:    Reason Eval/Treat Not Completed: (P) Patient not medically ready Pt currently on bed rest and MD request holding therapy today. PT will follow back tomorrow to assess appropriateness on treatment.  Rachel Samples B. Migdalia Dk PT, DPT Acute Rehabilitation Services Pager [phone] Office [phone]    [place] 05/08/2020, 9:15 AM

## 2020-05-08 NOTE — Progress Notes (Addendum)
At 0000: a patient was transferred from ED and Vital signs stable. HR has been A.fib between 90s and 120s. Mews yellow(2-3) due to irregular heart rate. Patient looks drowsy and opened his eyes with voice. He is only oriented to self. He follows simple commands but confused. He is restless and irritable. NIHSS "12" and modified SS "8"  Charge Nurse Vicente Males and Dr. Allyson Sabal are aware. Will continue to assess.     At 0350: MRI called and was asking for a sedation med for a patient in order to do MRI Brain. She reported that they attempted to do MRI on him twice but they couldn't get it when he was in ED because he was restless and was not able to be steady while MRI test.   He is still restless and moving himself. He is pulling nasal cannula and oxygen probe. Mittens are in place. He follows simple commands. I don't think he will be still for MRI without a med at this time. Dr. Allyson Sabal is aware. Will continue to assess.   At 0430: ativan IV once ordered for MRI. MRI was called and explained that patient is available for ativan. MRI will call me back when she is ready.

## 2020-05-08 NOTE — Progress Notes (Signed)
OT Cancellation Note  Patient Details Name: Trei Schoch MRN: [id number] DOB: 1929/09/16   Cancelled Treatment:    Reason Eval/Treat Not Completed: Active bedrest order;Other (comment) (Pt remains on bed rest. Messaged MD about lifting order and they requested hold at this time. Will continue to follow as available and appropriate.)  Zenovia Jarred, MSOT, OTR/L [facility] Office Number: [phone] Pager: [phone]  Zenovia Jarred 05/08/2020, 9:16 AM

## 2020-05-08 NOTE — Plan of Care (Signed)
°  Problem: Clinical Measurements: °Goal: Ability to maintain clinical measurements within normal limits will improve °Outcome: Progressing °Goal: Will remain free from infection °Outcome: Progressing °Goal: Diagnostic test results will improve °Outcome: Progressing °Goal: Respiratory complications will improve °Outcome: Progressing °Goal: Cardiovascular complication will be avoided °Outcome: Progressing °  °Problem: Coping: °Goal: Level of anxiety will decrease °Outcome: Progressing °  °

## 2020-05-09 ENCOUNTER — Other Ambulatory Visit: Payer: Self-pay

## 2020-05-09 ENCOUNTER — Encounter (HOSPITAL_COMMUNITY): Payer: Self-pay | Admitting: Internal Medicine

## 2020-05-09 DIAGNOSIS — I639 Cerebral infarction, unspecified: Secondary | ICD-10-CM

## 2020-05-09 DIAGNOSIS — I482 Chronic atrial fibrillation, unspecified: Secondary | ICD-10-CM

## 2020-05-09 DIAGNOSIS — E785 Hyperlipidemia, unspecified: Secondary | ICD-10-CM

## 2020-05-09 DIAGNOSIS — G9340 Encephalopathy, unspecified: Secondary | ICD-10-CM

## 2020-05-09 LAB — CBC
HCT: 44.7 % (ref 39.0–52.0)
Hemoglobin: 15.6 g/dL (ref 13.0–17.0)
MCH: 32.6 pg (ref 26.0–34.0)
MCHC: 34.9 g/dL (ref 30.0–36.0)
MCV: 93.5 fL (ref 80.0–100.0)
Platelets: 146 10*3/uL — ABNORMAL LOW (ref 150–400)
RBC: 4.78 MIL/uL (ref 4.22–5.81)
RDW: 12.5 % (ref 11.5–15.5)
WBC: 7.6 10*3/uL (ref 4.0–10.5)
nRBC: 0 % (ref 0.0–0.2)

## 2020-05-09 LAB — BASIC METABOLIC PANEL
Anion gap: 13 (ref 5–15)
BUN: 16 mg/dL (ref 8–23)
CO2: 24 mmol/L (ref 22–32)
Calcium: 9.4 mg/dL (ref 8.9–10.3)
Chloride: 102 mmol/L (ref 98–111)
Creatinine, Ser: 1.12 mg/dL (ref 0.61–1.24)
GFR, Estimated: >60 mL/min (ref >60)
Glucose, Bld: 130 mg/dL — ABNORMAL HIGH (ref 70–99)
Potassium: 4.1 mmol/L (ref 3.5–5.1)
Sodium: 139 mmol/L (ref 135–145)

## 2020-05-09 LAB — GLUCOSE, CAPILLARY: Glucose-Capillary: 122 mg/dL — ABNORMAL HIGH (ref 70–99)

## 2020-05-09 MED ORDER — DIGOXIN 125 MCG PO TABS: 0.1250 mg | ORAL_TABLET | Freq: Every day | ORAL | Status: DC

## 2020-05-09 MED ORDER — LACTATED RINGERS IV BOLUS
1000.0000 mL | Freq: Once | INTRAVENOUS | Status: AC
  Administered 2020-05-09: 1000 mL via INTRAVENOUS

## 2020-05-09 MED ORDER — VITAMIN D 25 MCG (1000 UNIT) PO TABS
2000.0000 [IU] | ORAL_TABLET | Freq: Every day | ORAL | Status: DC
  Administered 2020-05-09: 2000 [IU] via ORAL
  Filled 2020-05-09 (×2): qty 2

## 2020-05-09 MED ORDER — OSMOLITE 1.5 CAL PO LIQD
1000.0000 mL | ORAL | Status: DC
  Administered 2020-05-09: 1000 mL
  Filled 2020-05-09 (×2): qty 1000

## 2020-05-09 MED ORDER — TOBRAMYCIN 0.3 % OP SOLN
2.0000 [drp] | OPHTHALMIC | Status: DC
  Administered 2020-05-09 – 2020-05-10 (×7): 2 [drp] via OPHTHALMIC
  Filled 2020-05-09: qty 5

## 2020-05-09 MED ORDER — DIGOXIN 125 MCG PO TABS
0.1250 mg | ORAL_TABLET | ORAL | Status: DC
  Filled 2020-05-09: qty 1

## 2020-05-09 MED ORDER — OSMOLITE 1.2 CAL PO LIQD
1000.0000 mL | ORAL | Status: DC
  Filled 2020-05-09: qty 1000

## 2020-05-09 MED ORDER — ASPIRIN 81 MG PO CHEW
81.0000 mg | CHEWABLE_TABLET | Freq: Every day | ORAL | Status: DC
  Administered 2020-05-09: 81 mg via ORAL
  Filled 2020-05-09 (×2): qty 1

## 2020-05-09 MED ORDER — SCOPOLAMINE 1 MG/3DAYS TD PT72
1.0000 | MEDICATED_PATCH | TRANSDERMAL | Status: DC
  Administered 2020-05-09: 1.5 mg via TRANSDERMAL
  Filled 2020-05-09: qty 1

## 2020-05-09 MED ORDER — ADULT MULTIVITAMIN W/MINERALS CH
1.0000 | ORAL_TABLET | Freq: Every day | ORAL | Status: DC
  Administered 2020-05-09: 1 via ORAL
  Filled 2020-05-09 (×2): qty 1

## 2020-05-09 MED ORDER — PROSOURCE TF PO LIQD
45.0000 mL | Freq: Every day | ORAL | Status: DC
  Filled 2020-05-09: qty 45

## 2020-05-09 MED ORDER — DIGOXIN 125 MCG PO TABS: 0.2500 mg | ORAL_TABLET | ORAL | Status: DC

## 2020-05-09 MED ORDER — CALCIUM CARBONATE 1250 (500 CA) MG PO TABS
1.0000 | ORAL_TABLET | Freq: Every day | ORAL | Status: DC
  Administered 2020-05-09 – 2020-05-10 (×2): 500 mg via ORAL
  Filled 2020-05-09 (×2): qty 1

## 2020-05-09 MED ORDER — FREE WATER
200.0000 mL | Freq: Four times a day (QID) | Status: DC
  Administered 2020-05-09 – 2020-05-10 (×3): 200 mL

## 2020-05-09 MED ORDER — TOBRAMYCIN 0.3 % OP SOLN: 2.0000 [drp] | OPHTHALMIC | Status: DC

## 2020-05-09 MED ORDER — POLYVINYL ALCOHOL 1.4 % OP SOLN
1.0000 [drp] | Freq: Every day | OPHTHALMIC | Status: DC
  Administered 2020-05-09 – 2020-05-10 (×2): 1 [drp] via OPHTHALMIC
  Filled 2020-05-09: qty 15

## 2020-05-09 MED ORDER — ASCORBIC ACID 500 MG PO TABS
1000.0000 mg | ORAL_TABLET | Freq: Every day | ORAL | Status: DC
  Administered 2020-05-09: 1000 mg via ORAL
  Filled 2020-05-09 (×2): qty 2

## 2020-05-09 NOTE — Progress Notes (Signed)
OT Cancellation Note  Patient Details Name: Colton Bonilla MRN: [id number] DOB: 1929-09-25   Cancelled Treatment:     md is asking therapy to hold evalutions. Pt is still on bedrest.   Siham Bucaro 05/09/2020, 10:27 AM

## 2020-05-09 NOTE — Progress Notes (Signed)
Since beginning of shift (0700), pt has been having difficulty clearing his airway. Yaunker suction is set up at bedside, but pt still struggling to clear thick secretions in throat. NTS performed by RT this AM. Pt's respiratory status immediately improved.  Wife [facility]) has been at bedside throughout the day and has been educated on oral yaunker suctioning technique, as the patient requires suctioning often.   Pt's wife discussed Cortrak placement with MD earlier today. She has been concerned about his poor nutrition d/t NPO status. Pt stated he did not wish to have the tube placed, but pt's wife made the decision to have it placed. Cortrak is now placed and working well.  Pt's wife remains at bedside, along with daughter. Pt is stating he would like to "go home". Per patient's family, "home" means heaven.

## 2020-05-09 NOTE — Progress Notes (Signed)
   05/09/20 1939  Assess: MEWS Score  Temp 97.8 F (36.6 C)  BP (!) 143/78  Pulse Rate (!) 116  ECG Heart Rate (!) 133  Resp 20  Level of Consciousness Alert  SpO2 100 %  O2 Device Nasal Cannula  Patient Activity (if Appropriate) In bed  O2 Flow Rate (L/min) 0.5 L/min  Assess: MEWS Score  MEWS Temp 0  MEWS Systolic 0  MEWS Pulse 3  MEWS RR 0  MEWS LOC 0  MEWS Score 3  MEWS Score Color Yellow  Assess: if the MEWS score is Yellow or Red  Were vital signs taken at a resting state? Yes  Focused Assessment No change from prior assessment  Early Detection of Sepsis Score *See Row Information* Low  MEWS guidelines implemented *See Row Information* No, vital signs rechecked  Treat  MEWS Interventions Other (Comment)  Pain Scale PAINAD  Breathing 0  Negative Vocalization 1  Facial Expression 0  Body Language 1  Consolability 1  PAINAD Score 3  Interventions Relaxation  Take Vital Signs  Increase Vital Sign Frequency  Yellow: Q 2hr X 2 then Q 4hr X 2, if remains yellow, continue Q 4hrs  Escalate  MEWS: Escalate Yellow: discuss with charge nurse/RN and consider discussing with provider and RRT  Notify: Charge Nurse/RN  Name of Charge Nurse/RN Notified Nikki RN  Date Charge Nurse/RN Notified 05/09/20  Time Charge Nurse/RN Notified 1945  Notify: Provider  Provider Name/Title Court Joy   Date Provider Notified 05/09/20  Time Provider Notified 1942  Notification Type Page  Notification Reason Other (Comment)  Response See new orders  Date of Provider Response 05/09/20  Time of Provider Response 1950  Document  Patient Outcome Other (Comment) (HR continues to fluctuate d/t Afib. MD aware/cont to monitor)  Progress note created (see row info) Yes

## 2020-05-09 NOTE — Progress Notes (Addendum)
Dr. Court Joy at bedside. RT to come and perform deep suctioning ordered. No new orders at this time. Will continue to monitor

## 2020-05-09 NOTE — Progress Notes (Signed)
STROKE TEAM PROGRESS NOTE   INTERVAL HISTORY Wife at bedside.  Patient lying in bed, seems more awake alert than yesterday, more interactive.  Still has severe dysarthria, dysphagia, however moving all extremities, with mild left-sided weakness.  Discussed with wife in length regarding cortrak for tube feeding vs. More comfort approach, wife requested tube feeding and resume home medication.  I also called palliative care service for [place] discussion with patient and wife.  Vitals:   05/08/20 1356 05/08/20 2100 05/09/20 0000 05/09/20 0400  BP: 107/72 121/86 131/80 126/90  Pulse: 93 100 98 100  Resp: 18 20 15 17   Temp:  98.2 F (36.8 C) 98.6 F (37 C) 97.7 F (36.5 C)  TempSrc:  Axillary Axillary Axillary  SpO2: 100% 99% 99% 97%  Weight:      Height:       CBC:  Recent Labs  Lab 05/07/20 0607 05/07/20 0607 05/08/20 0739 05/09/20 0121  WBC 5.4   < > 10.1 7.6  NEUTROABS 3.2  --  8.0*  --   HGB 14.9   < > 15.8 15.6  HCT 46.1   < > 46.8 44.7  MCV 97.9   < > 93.6 93.5  PLT 140*   < > 150 146*   < > = values in this interval not displayed.   Basic Metabolic Panel:  Recent Labs  Lab 05/08/20 0739 05/09/20 0121  NA 139 139  K 4.0 4.1  CL 101 102  CO2 25 24  GLUCOSE 148* 130*  BUN 17 16  CREATININE 1.04 1.12  CALCIUM 9.6 9.4   Lipid Panel:  Recent Labs  Lab 05/07/20 0607  CHOL 88  TRIG 145  HDL 31*  CHOLHDL 2.8  VLDL 29  LDLCALC 28   HgbA1c:  Recent Labs  Lab 05/07/20 0607  HGBA1C 6.5*   Urine Drug Screen:  Recent Labs  Lab 05/07/20 1115  LABOPIA NONE DETECTED  COCAINSCRNUR NONE DETECTED  LABBENZ NONE DETECTED  AMPHETMU NONE DETECTED  THCU NONE DETECTED  LABBARB NONE DETECTED    Alcohol Level  Recent Labs  Lab 05/07/20 0531  ETH <10    IMAGING past 24 hours EEG  Result Date: 05/08/2020 Lora Havens, MD     05/08/2020 12:33 PM Patient Name: Colton Bonilla MRN: [id number] Epilepsy Attending: Lora Havens Referring Physician/Provider:  Dr Lesleigh Noe Date: 05/08/2020 Duration: 25.35 mins Patient history: 86 year old male with sudden worsening of mental status.  MRI brain showed infarct in posterolateral right frontal lobe and right frontal operculum as well as high left parietal lobe.  EEG to evaluate for seizures. Level of alertness: Awake, asleep AEDs during EEG study: None Technical aspects: This EEG study was done with scalp electrodes positioned according to the 10-20 International system of electrode placement. Electrical activity was acquired at a sampling rate of 500Hz  and reviewed with a high frequency filter of 70Hz  and a low frequency filter of 1Hz . EEG data were recorded continuously and digitally stored. Description: No clear posterior dominant rhythm was seen. Sleep was characterized by vertex waves, sleep spindles (12 to 14 Hz), maximal frontocentral region. EEG showed continuous generalized polymorphic predominantly 5 to 7 Hz theta slowing as well as intermittent generalized 2 to 3 Hz delta activity. There is also 15 to 18 Hz beta activity in bilateral frontocentral region. Hyperventilation and photic stimulation were not performed.   ABNORMALITY -Continuous slow, generalized IMPRESSION: This study is suggestive of moderate diffuse encephalopathy, nonspecific etiology. No seizures or epileptiform discharges were  seen throughout the recording. Priyanka Barbra Sarks    PHYSICAL EXAM  Temp:  [97.7 F (36.5 C)-98.6 F (37 C)] 97.7 F (36.5 C) (12/03 0400) Pulse Rate:  [93-100] 100 (12/03 0400) Resp:  [15-20] 17 (12/03 0400) BP: (107-131)/(72-90) 126/90 (12/03 0400) SpO2:  [97 %-100 %] 97 % (12/03 0400)  General - Well nourished, well developed, lethargic, but more interactive than yesterday.  Ophthalmologic - fundi not visualized due to noncooperation.  Cardiovascular - irregularly irregular heart rate and rhythm with intermittent RVR.  Neuro - lethargic, more interactive with than yesterday, able to open eyes on  voice, severe dysarthria and hypophonia, orientated to people and place but not to time or age. Able to follow simple commands. Not cooperative on naming or repeating. Right gaze preference, barely cross midline. Not blinking to visual threat bilaterally. PERRL. Right facial droop. Tongue protrusion not cooperative. LUE mild drift with decreased finger grip, RUE 4/5 at least. RLE 4/5 with knee flexion, LLE 3+/5 proximal with knee flexion and 4/5 distally. Sensation subjectively symmetrical. FTN not cooperative. Gait not tested.   ASSESSMENT/PLAN Mr. Colton Bonilla is a 86 y.o. male with history of atrial fibrillation (unclear if on anticoagulation), hypertension, hyperlipidemia, coronary artery disease, prostate cancer presenting with  left facial droop, severe dysarthria, and potentially worsened left-sided weakness..   Stroke:  Moderate right frontal lobe infarcts and punctate L parietal lobe infarct, embolic secondary to AF not on [facility]  Code Stroke CT head No acute abnormality. Subacute to chronic L parieto-occipital infarct w/ petechial hemorrhage. Small vessel disease. ASPECTS 10.     CTA head & neck no ELVO. RV4 50% stenosis. High-grade proximal BA stenosis. Cervical atherosclerosis   Repeat CT head w/ neuro worsening (reported agitated, delirious, unable to move L arm or leg; MD exam unchanged) - evolving lateral R precentral gyrus infarct w/ petechial hemorrhage    EEG  Continuous slowing, no sz  MRI w/ ativan  Posterior R frontal lobe and R frontal opercular infarct. High L parietal lobe punctate infarct. Chronic L parieto-occipital cortical/subcortical infarcts.   2D Echo EF 55-60%  LDL 28  HgbA1c 6.5  VTE prophylaxis - Lovenox 40 mg sq daily   aspirin 81 mg daily prior to admission, now on ASA 300 PR. Given size of infarct and petechial hemorrhage seen on CT, will consider NOAC in 10-14 days post stroke   Therapy recommendations:  pending   Disposition:  pending    Palliative care consult placed for [place] discussion  Atrial Fibrillation w/ RVR  Home anticoagulation:  none    hx LE bleeding from varicose vein in 2015, presented to ED. stable;   Per wife, pt had stroke in 2004 and found to have afib. Put on pradaxa, however, pt had LGIB vs. Hematuria after that, and he was taken off pradaxa and put on ASA. . Will rechallenge with NOAC 10-14 days post stroke  . Wife is in agreement, wife does not want pradaxa this time   History of stroke  Per wife, stroke 2004, found to have A. fib, on Pradaxa, but later on stopped due to LGIB vs. Hematuria  Recent stroke on 03/28/2020 with right hemianopia.  Admitted in [facility], details not clear.  However, by reviewing images,04/03/2020 CT head showed subacute infarct and petechial hemorrhage involving the left parietal lobe.  Same-day MRI showed 3 mm acute infarct within the right frontal lobe white matter.  Late subacute infarct within the left parietal lobe.  Chronic right frontal paramedian, and right  basal ganglia lacunar infarcts.  Small chronic infarct within the right cerebellum.  Carotid Doppler negative.  Repeat CT 04/10/2020 showed unchanged late subacute left parietal infarct with cortical laminar necrosis.  Per wife, he was put on low dose eliquis but then his PCP put him back on ASA due to petechial hemorrhage.  Hypertension  Stable on the low end . Permissive hypertension (OK if < 180/105 due to petechial hemorrahge) but gradually normalize in 3-5 days . Long-term BP goal normotensive  Hyperlipidemia  Home meds:  zocor 40 and fish oil, resumed in hospital  LDL 28, goal < 70  Decrease Zocor to 20 given low LDL (moderate/intensive statin not recommended)  Continue statin at discharge  Dysphagia . Secondary to stroke . NPO . off IV fluid . Cortrak w/ TF . Resume home medication . Speech on board   Other Stroke Risk Factors  Advanced Age >/= 37   Other Active Problems  Hx  prostate cancer  Hx testicular cancer  AAA 4.6 at C5 in 06/2019, stable [facility] day # 2  I had a lengthy discussion with wife at bedside regarding patient wishes, tube feeding, long-term care and prognosis.  I spent  35 minutes in total face-to-face time with the patient, more than 50% of which was spent in counseling and coordination of care, reviewing test results, images and medication, and discussing the diagnosis, treatment plan and potential prognosis. This patient's care requiresreview of multiple databases, neurological assessment, discussion with family, other specialists and medical decision making of high complexity.  I also discussed with internal medicine resident.  Rosalin Hawking, MD PhD Stroke Neurology 05/09/2020 10:16 AM  To contact Stroke Continuity provider, please refer to [email or web address] After hours, contact General Neurology

## 2020-05-09 NOTE — Evaluation (Addendum)
Clinical/Bedside Swallow Evaluation Patient Details  Name: Colton Bonilla MRN: [id number] Date of Birth: [birth year]  Today's Date: 05/09/2020 Time: SLP Start Time (ACUTE ONLY): 0805 SLP Stop Time (ACUTE ONLY): 0840 SLP Time Calculation (min) (ACUTE ONLY): 35 min  Past Medical History:  Past Medical History:  Diagnosis Date   A-fib    Cancer    Prostate cancer    Testicular cancer    Past Surgical History:  Past Surgical History:  Procedure Laterality Date   PROSTATE SURGERY     TESTICLE REMOVAL Right    HPI: 86 y/o gentleman with history of chronic afib (not anticoagulated), HTN, prior CVA in October with hemianopsia, and multiple TIAs who presented from home with acute onset severe dysarthria.  CT head showed late subacute to chronic left parietooccipital infact.  4.1 x 3.3 x 5.5 cm region of restricted diffusion within theposterolateral right frontal lobe and right frontal operculum(including involvement of the lateral right precentral gyrus).  Pt has been too obtunded for swallow evaluation.     Assessment / Plan / Recommendation Clinical Impression  Patient currently is awake, presents with severe dysarthria and dysphagia.  SLP and RN slid pt up in bed and SLP provided oral care as best able - pt is resistant stating it hurts via yes/no question.  Wet vocal quality noted that worsened with oral care.  Verbal cues to cough strongly, "hock" to expectorate or swallow were not effective to clear secretions.   He is obviously uncomfortable and desires to have secretions cleared - via yes/no ?.  Pt also with apnea at times without awareness.  Pt is not ready for po initake and recommend strict oral care- consider NTS to clear secretions.    Pt repeately states "Colton Bonilla" even after being advised RN has reached out to her to request she come to hospital *per pt request.  SLP reached out to MD and was advised they would come to see this pt.  SLP Visit Diagnosis: Dysphagia,  oropharyngeal phase (R13.12)    Aspiration Risk  Severe aspiration risk;Risk for inadequate nutrition/hydration    Diet Recommendation NPO (frequent oral care , consider using toothettes for moisture as pt tolerates)   Medication Administration: Via alternative means    Other  Recommendations Oral Care Recommendations: Oral care QID   Follow up Recommendations Other (comment) (tbd)      Frequency and Duration min 1 x/week  1 week       Prognosis Prognosis for Safe Diet Advancement: Guarded Barriers to Reach Goals: Severity of deficits      Swallow Study   General   86 y/o gentleman with history of chronic afib (not anticoagulated), HTN, prior CVA in October with hemianopsia, and multiple TIAs who presented from home with acute onset severe dysarthria.  CT head showed late subacute to chronic left parietooccipital infact.  4.1 x 3.3 x 5.5 cm region of restricted diffusion within theposterolateral right frontal lobe and right frontal operculum(including involvement of the lateral right precentral gyrus).  Pt has been too obtunded for swallow evaluation.   Oral/Motor/Sensory Function Overall Oral Motor/Sensory Function: Moderate impairment Facial ROM: Reduced right;Suspected CN VII (facial) dysfunction Facial Symmetry: Abnormal symmetry right;Suspected CN VII (facial) dysfunction Facial Strength: Reduced right;Suspected CN VII (facial) dysfunction Lingual ROM: Suspected CN XII (hypoglossal) dysfunction Lingual Strength: Reduced Velum: Other (comment) Mandible: Other (Comment)   Ice Chips Ice chips: Not tested   Thin Liquid Thin Liquid: Impaired Oral Phase Impairments: Reduced labial seal Pharyngeal  Phase Impairments: Cough -  Immediate;Cough - Delayed Other Comments: oral moisture via toothette to loose secretions, pt did not elicit swallow and cough is weak and not effective    Nectar Thick Nectar Thick Liquid: Not tested   Honey Thick Honey Thick Liquid: Not tested   Puree  Puree: Not tested   Solid     Solid: Not tested      Macario Golds 05/09/2020,8:58 AM  Kathleen Lime, MS [facility] SLP [place] Office [phone] Pager [phone]

## 2020-05-09 NOTE — Progress Notes (Signed)
Subjective: HD#2 No acute overnight events. Patient evaluated at bedside this AM.  Colton Bonilla developed apneic breathing with gurgling breathing this morning. His breathing has improved after suctioning by RT. His wife states he is doing better than yesterday, able to speak to her. He had been calling out for her during his breathing troubles. She states he has been sleeping more than usual today and she notes that he does seem to be more confused than usual. His nurse states he has been moving his left arm but still has decreased grip strength. He has not been able to cough per nursing staff. His wife states he was on Digoxin and Metoprolol at home for his A-Fib. She states he had a stroke in 2004  for which he was started on Pradaxa, and had GI bleeding shortly thereafter. He did not require blood transfusions for this bleed. His wife notes he was started on a very low dose of Eliquis in the past but never put on Pradaxa again. His wife states he has been on Pataday and dry eye drops at home for dry eyes but has not received this in the hospital.   Objective:  Vital signs in last 24 hours: Vitals:   05/08/20 2100 05/09/20 0000 05/09/20 0400 05/09/20 1218  BP: 121/86 131/80 126/90 114/83  Pulse: 100 98 100 99  Resp: 20 15 17 18   Temp: 98.2 F (36.8 C) 98.6 F (37 C) 97.7 F (36.5 C) 98.3 F (36.8 C)  TempSrc: Axillary Axillary Axillary Axillary  SpO2: 99% 99% 97% 100%  Weight:      Height:       CBC Latest Ref Rng & Units 05/09/2020 05/08/2020 05/07/2020  WBC 4.0 - 10.5 K/uL 7.6 10.1 5.4  Hemoglobin 13.0 - 17.0 g/dL 15.6 15.8 14.9  Hematocrit 39 - 52 % 44.7 46.8 46.1  Platelets 150 - 400 K/uL 146(L) 150 140(L)   BMP Latest Ref Rng & Units 05/09/2020 05/08/2020 05/07/2020  Glucose 70 - 99 mg/dL 130(H) 148(H) 158(H)  BUN 8 - 23 mg/dL 16 17 23   Creatinine 0.61 - 1.24 mg/dL 1.12 1.04 1.30(H)  Sodium 135 - 145 mmol/L 139 139 140  Potassium 3.5 - 5.1 mmol/L 4.1 4.0 4.5  Chloride 98 -  111 mmol/L 102 101 103  CO2 22 - 32 mmol/L 24 25 -  Calcium 8.9 - 10.3 mg/dL 9.4 9.6 -    Physical Exam General: Well developed, well nourished, somnolent, mumbles at times HEENT: L eye-crusting/flakes under base of eyelids.  Cardio: Irregularly irregular rate and rhythm.  Lungs: CTAB Neuro: AAO*1. He is oriented to self but not place.  Extremities: No peripheral edema.   Assessment/Plan: Colton Bonilla a 86 y.o.malewith history of atrial fibrillation (not on anticoagulation), hypertension, hyperlipidemia, coronary artery disease, prostate cancerpresenting with left facial droop, severe dysarthria, and potentially worsened left-sided weakness.  Active Problems:   CVA (cerebral vascular accident) [facility])  # Stroke #Right Brain Infarct 2/2 AF # Acute Encephalopathy On initial evaluation patient was alert, awake but not oriented. He was asking for help as was not able to breathe due to unable to swallow. SLP evaluated and were not able to suction the secretions and Respi was called to do NTS to clear the secretions. Canceled PT/OT as he has bed rest orders. Due to concern of ongoing no oral intake/ also wife's concentration on food intake, plan is to do Cortrak nasoenteral feeding tubes. Neuro is consulted and as patient not able to hold his secretions down,  also, do not foresee any changes in his swallowing ability , palliative care is in place.  -- Appreciate Neuro assistance. -- Restart aspirin 81 mg daily  -- Tele monitoring  -- SLP eval -- frequent neuro checks  -- Heparin SubQ --SLP eval --D/C PT/OT eval --Scopolamine patch every 72 hours --VTE prophylaxis - Lovenox 40 mg sq daily   # Chronic afib Patient has long history of Atrial fibrillation. He is not on anticoagulation due to past GI Bleeding, previously was on Pradaxa. Plan is to try to start anticoagulants during this admission as he had multiple strokes in last 6 months.    -- Continue IV Digoxin 0.125 mg  every 48 hours and 0.25 mg every 48 hours.  # Hyperlipidemia At homer Zocor 40 mg, LDL 28, goal <70, decreased Zocor from 40 to 20.  - C/W Zocor 20  # L eye Bacterial Blepharitis Patient unable to open his Left eye due to crusting at the base of eyelids.  - Start Tobramycin 2 drops Q4H   Prior to Admission Living Arrangement: Home Anticipated Discharge Location: Home Barriers to Discharge: Ongoing medical management Dispo: Anticipated discharge in approximately 3-4 day(s).   Honor Junes, MD 05/09/2020, 2:52 PM Pager: [phone] After 5pm on weekdays and 1pm on weekends: On Call pager [phone]

## 2020-05-09 NOTE — Progress Notes (Signed)
Initial Nutrition Assessment  DOCUMENTATION CODES:   Not applicable  INTERVENTION:   -Initiate Osmolite 1.5 @ 20 ml/hr via cortrak and increase by 10 ml every 4 hours to goal rate of 50 ml/hr.   45 ml Prosource TF daily  200 ml free water flush QID  Tube feeding regimen provides 1840 kcal (100% of needs), 86 grams of protein, and 914 ml of H2O. Total free water: 1714 ml daily  NUTRITION DIAGNOSIS:   Inadequate oral intake related to dysphagia, inability to eat as evidenced by NPO status.  GOAL:   Patient will meet greater than or equal to 90% of their needs  MONITOR:   Diet advancement, Labs, Weight trends, TF tolerance, Skin, I & O's  REASON FOR ASSESSMENT:   Consult Enteral/tube feeding initiation and management  ASSESSMENT:   Colton Bonilla is an 86 y/o gentleman with history of chronic afib (not anticoagulated), HTN, prior CVA and multiple TIAs who presents with acute onset slurred speech.  Pt admitted with CVA.   12/2- per SLP too obtunded to participate in BSE 12/3- s/p BSE- recommend continued NPO, cortrak placed (tip of tube confirmed in stomach), TF initiated  Reviewed I/O's: +327 ml x 24 hours and -333 ml since admission  UOP: 725 ml x 24 hours  Spoke with pt wife at bedside, who provided history. Per wife, pt was in his usual state of health up until Tuesday (3 days ago). Pt reports that pt is a little unsteady on his feet and had a good appetite with no swallowing difficulties PTA. He generally consumes 3 meals per day (Breakfast: eggs, toast, and grits, lunch: sandwich, and dinner: meat, starch, and vegetable). Wife shares that pt had a stroke a few months ago and was able to recover quickly; this previous stroke did not impact his ability to swallow.   Pt wife denies weight loss. Noted distant history of weight loss.   Discussed SLP recommendations with wife and plan to place cortrak tube. Explained to wife that cortrak tube is a temporary means of  nutrition support, but will provide him with nutrition in the meantime, as well as describing the process of feeding tube placement and how pt will receive nutrition. Educated pt wife that length of time dependent on tube feeding is contingent on how pt progresses; discussed how feedings can be weaned if pt is able to pass a swallow evaluation and demonstrate adequate oral intake. Noted palliative care consult pending. Provided emotional support and reflective listening to pt wife. Explained how TF formula would be administered through tube and showed her a picture of prescribed formula so she would know what to expect.   Medications reviewed and include vitamin C, oscal with vitamin D, vitamin D3, and MVI.  Labs reviewed.   NUTRITION - FOCUSED PHYSICAL EXAM:    Most Recent Value  Orbital Region No depletion  Upper Arm Region Mild depletion  Thoracic and Lumbar Region No depletion  Buccal Region No depletion  Temple Region No depletion  Clavicle Bone Region No depletion  Clavicle and Acromion Bone Region No depletion  Scapular Bone Region No depletion  Dorsal Hand Mild depletion  Patellar Region Mild depletion  Anterior Thigh Region Mild depletion  Posterior Calf Region Mild depletion  Edema (RD Assessment) None  Hair Reviewed  Eyes Reviewed  Mouth Reviewed  Skin Reviewed  Nails Reviewed       Diet Order:   Diet Order            Diet  NPO time specified  Diet effective now                 EDUCATION NEEDS:   Education needs have been addressed  Skin:  Skin Assessment: Reviewed RN Assessment  Last BM:  05/08/20  Height:   Ht Readings from Last 1 Encounters:  05/07/20 5\' 11"  (1.803 m)    Weight:   Wt Readings from Last 1 Encounters:  05/08/20 68.7 kg    Ideal Body Weight:  78.2 kg  BMI:  Body mass index is 21.12 kg/m.  Estimated Nutritional Needs:   Kcal:  1700-1900  Protein:  85-100 grams  Fluid:  > 1.7 L    Loistine Chance, RD, LDN, [place] Registered  Dietitian II Certified Diabetes Care and Education Specialist Please refer to [facility] for RD and/or RD on-call/weekend/after hours pager

## 2020-05-09 NOTE — Procedures (Signed)
Cortrak  Person Inserting Tube:  Esaw Dace, RD Tube Type:  Cortrak - 43 inches Initial Placement:  Stomach Secured by: Bridle Technique Used to Measure Tube Placement:  Documented cm marking at nare/ corner of mouth Cortrak Secured At:  72 cm Procedure Comments:  Cortrak Tube Team Note:  Consult received to place a Cortrak feeding tube.   No x-ray is required. RN may begin using tube.   If the tube becomes dislodged please keep the tube and contact the Cortrak team at [email or web address] ([email or web address]) for replacement.  If after hours and replacement cannot be delayed, place a NG tube and confirm placement with an abdominal x-ray.    Kerman Passey MS, RDN, LDN, CNSC Registered Dietitian III Clinical Nutrition RD Pager and On-Call Pager Number Located in [place]

## 2020-05-09 NOTE — Progress Notes (Signed)
SLP  Note  Patient Details Name: Colton Bonilla MRN: [id number] DOB: February 23, 1930   Note pt now has Cortrak, SLP will follow up Monday 12/6 to determine improvement in swallowing ability and potential readiness for po intake given he now has alternative means.    Kathleen Lime, MS [facility] SLP Acute Rehab Services Office [phone] Pager [phone]     Macario Golds 05/09/2020, 6:05 PM

## 2020-05-09 NOTE — Progress Notes (Signed)
Paged for increased pulse rate and discussed with RN. Patient was suctioned well and on review of telemetry now improved , Afib with HR sustaining <110. Orders in for RT to perform deep suction. No further changes at this time.

## 2020-05-09 NOTE — Progress Notes (Deleted)
   05/09/20 1939  Assess: MEWS Score  Temp 97.8 F (36.6 C)  BP (!) 143/78  Pulse Rate (!) 116  ECG Heart Rate (!) 133  Resp 20  Level of Consciousness Alert  SpO2 100 %  O2 Device Nasal Cannula  Patient Activity (if Appropriate) In bed  O2 Flow Rate (L/min) 0.5 L/min  Assess: MEWS Score  MEWS Temp 0  MEWS Systolic 0  MEWS Pulse 3  MEWS RR 0  MEWS LOC 0  MEWS Score 3  MEWS Score Color Yellow  Assess: if the MEWS score is Yellow or Red  Were vital signs taken at a resting state? Yes  Focused Assessment No change from prior assessment  Early Detection of Sepsis Score *See Row Information* Low  MEWS guidelines implemented *See Row Information* No, vital signs rechecked  Treat  MEWS Interventions Other (Comment)  Pain Scale PAINAD  Breathing 0  Negative Vocalization 1  Facial Expression 0  Body Language 1  Consolability 1  PAINAD Score 3  Interventions Relaxation  Take Vital Signs  Increase Vital Sign Frequency  Red: Q 1hr X 4 then Q 4hr X 4, if remains red, continue Q 4hrs  Escalate  MEWS: Escalate Yellow: discuss with charge nurse/RN and consider discussing with provider and RRT  Notify: Charge Nurse/RN  Name of Charge Nurse/RN Notified Nikki RN  Date Charge Nurse/RN Notified 05/09/20  Time Charge Nurse/RN Notified 1945  Notify: Provider  Provider Name/Title Court Joy   Date Provider Notified 05/09/20  Time Provider Notified 1942  Notification Type Page  Notification Reason Other (Comment)  Response See new orders  Date of Provider Response 05/09/20  Time of Provider Response 1950  Document  Patient Outcome Other (Comment) (HR continues to fluctuate d/t Afib. MD aware/cont to monitor)  Progress note created (see row info) Yes

## 2020-05-10 DIAGNOSIS — Z515 Encounter for palliative care: Secondary | ICD-10-CM

## 2020-05-10 DIAGNOSIS — Z7189 Other specified counseling: Secondary | ICD-10-CM

## 2020-05-10 DIAGNOSIS — Z66 Do not resuscitate: Secondary | ICD-10-CM

## 2020-05-10 DIAGNOSIS — R4182 Altered mental status, unspecified: Secondary | ICD-10-CM

## 2020-05-10 LAB — BASIC METABOLIC PANEL
Anion gap: 9 (ref 5–15)
BUN: 18 mg/dL (ref 8–23)
CO2: 24 mmol/L (ref 22–32)
Calcium: 8.9 mg/dL (ref 8.9–10.3)
Chloride: 102 mmol/L (ref 98–111)
Creatinine, Ser: 0.99 mg/dL (ref 0.61–1.24)
GFR, Estimated: >60 mL/min (ref >60)
Glucose, Bld: 186 mg/dL — ABNORMAL HIGH (ref 70–99)
Potassium: 4 mmol/L (ref 3.5–5.1)
Sodium: 135 mmol/L (ref 135–145)

## 2020-05-10 LAB — CBC
HCT: 43.5 % (ref 39.0–52.0)
Hemoglobin: 14.8 g/dL (ref 13.0–17.0)
MCH: 32 pg (ref 26.0–34.0)
MCHC: 34 g/dL (ref 30.0–36.0)
MCV: 94 fL (ref 80.0–100.0)
Platelets: 141 10*3/uL — ABNORMAL LOW (ref 150–400)
RBC: 4.63 MIL/uL (ref 4.22–5.81)
RDW: 12.6 % (ref 11.5–15.5)
WBC: 6.3 10*3/uL (ref 4.0–10.5)
nRBC: 0 % (ref 0.0–0.2)

## 2020-05-10 LAB — GLUCOSE, CAPILLARY
Glucose-Capillary: 167 mg/dL — ABNORMAL HIGH (ref 70–99)
Glucose-Capillary: 184 mg/dL — ABNORMAL HIGH (ref 70–99)
Glucose-Capillary: 188 mg/dL — ABNORMAL HIGH (ref 70–99)

## 2020-05-10 MED ORDER — HALOPERIDOL LACTATE 5 MG/ML IJ SOLN: 2.0000 mg | Freq: Once | INTRAMUSCULAR | Status: DC

## 2020-05-10 MED ORDER — BISACODYL 10 MG RE SUPP: 10.0000 mg | Freq: Every day | RECTAL | Status: DC | PRN

## 2020-05-10 MED ORDER — GLYCOPYRROLATE 0.2 MG/ML IJ SOLN
0.4000 mg | INTRAMUSCULAR | Status: DC
  Administered 2020-05-10: 0.4 mg via INTRAVENOUS
  Filled 2020-05-10: qty 2

## 2020-05-10 MED ORDER — LORAZEPAM 2 MG/ML PO CONC: 1.0000 mg | ORAL | 0 refills | Status: AC | PRN

## 2020-05-10 MED ORDER — MORPHINE SULFATE (CONCENTRATE) 10 MG/0.5ML PO SOLN
5.0000 mg | ORAL | Status: DC | PRN
  Administered 2020-05-10: 5 mg via SUBLINGUAL

## 2020-05-10 MED ORDER — MORPHINE SULFATE (PF) 2 MG/ML IV SOLN: 2.0000 mg | INTRAVENOUS | Status: DC | PRN

## 2020-05-10 MED ORDER — SCOPOLAMINE 1 MG/3DAYS TD PT72: 1.0000 | MEDICATED_PATCH | TRANSDERMAL | 12 refills | Status: AC

## 2020-05-10 MED ORDER — MORPHINE SULFATE (CONCENTRATE) 10 MG/0.5ML PO SOLN
5.0000 mg | ORAL | Status: DC | PRN
  Filled 2020-05-10: qty 0.5

## 2020-05-10 MED ORDER — ORAL CARE MOUTH RINSE: 15.0000 mL | Freq: Two times a day (BID) | OROMUCOSAL | 0 refills | Status: AC

## 2020-05-10 MED ORDER — MORPHINE SULFATE (CONCENTRATE) 10 MG/0.5ML PO SOLN: 5.0000 mg | ORAL | 0 refills | Status: AC | PRN

## 2020-05-10 MED ORDER — MELATONIN 3 MG PO TABS: 3.0000 mg | ORAL_TABLET | Freq: Every evening | ORAL | Status: DC | PRN

## 2020-05-10 MED ORDER — HALOPERIDOL LACTATE 5 MG/ML IJ SOLN: 2.0000 mg | Freq: Four times a day (QID) | INTRAMUSCULAR | Status: DC | PRN

## 2020-05-10 MED ORDER — OLANZAPINE 5 MG PO TBDP: 5.0000 mg | ORAL_TABLET | Freq: Every day | ORAL | Status: DC

## 2020-05-10 MED ORDER — GLYCOPYRROLATE 0.2 MG/ML IJ SOLN: 0.2000 mg | INTRAMUSCULAR | 0 refills | Status: AC | PRN

## 2020-05-10 MED ORDER — BISACODYL 10 MG RE SUPP: 10.0000 mg | Freq: Every day | RECTAL | 0 refills | Status: AC | PRN

## 2020-05-10 MED ORDER — WHITE PETROLATUM EX OINT
TOPICAL_OINTMENT | CUTANEOUS | Status: AC
  Administered 2020-05-10: 1
  Filled 2020-05-10: qty 28.35

## 2020-05-10 MED ORDER — GLYCOPYRROLATE 1 MG PO TABS: 1.0000 mg | ORAL_TABLET | ORAL | 0 refills | Status: AC | PRN

## 2020-05-10 MED ORDER — GLYCOPYRROLATE 0.2 MG/ML IJ SOLN: 0.2000 mg | INTRAMUSCULAR | Status: DC | PRN

## 2020-05-10 MED ORDER — ACETAMINOPHEN 650 MG RE SUPP: 650.0000 mg | Freq: Four times a day (QID) | RECTAL | Status: DC | PRN

## 2020-05-10 MED ORDER — OLANZAPINE 5 MG PO TBDP: 5.0000 mg | ORAL_TABLET | Freq: Every day | ORAL | 0 refills | Status: AC | PRN

## 2020-05-10 MED ORDER — LORAZEPAM 2 MG/ML PO CONC: 1.0000 mg | ORAL | Status: DC | PRN

## 2020-05-10 MED ORDER — GLYCOPYRROLATE 1 MG PO TABS
1.0000 mg | ORAL_TABLET | ORAL | Status: DC | PRN
  Filled 2020-05-10: qty 1

## 2020-05-10 MED ORDER — ONDANSETRON 4 MG PO TBDP: 4.0000 mg | ORAL_TABLET | Freq: Four times a day (QID) | ORAL | Status: DC | PRN

## 2020-05-10 MED ORDER — ACETAMINOPHEN 650 MG RE SUPP: 650.0000 mg | RECTAL | 0 refills | Status: AC | PRN

## 2020-05-10 MED ORDER — ONDANSETRON HCL 4 MG/2ML IJ SOLN: 4.0000 mg | Freq: Four times a day (QID) | INTRAMUSCULAR | Status: DC | PRN

## 2020-05-10 MED ORDER — LORAZEPAM 2 MG/ML IJ SOLN: 0.5000 mg | INTRAMUSCULAR | Status: DC | PRN

## 2020-05-10 MED ORDER — ONDANSETRON 4 MG PO TBDP: 4.0000 mg | ORAL_TABLET | Freq: Four times a day (QID) | ORAL | 0 refills | Status: AC | PRN

## 2020-05-10 NOTE — Progress Notes (Signed)
Patient on comfort care now; tube feeding was stopped per order.

## 2020-05-10 NOTE — Discharge Summary (Signed)
Name: Colton Bonilla MRN: [id number] DOB: 07-13-1929 86 y.o. PCP: Myer Peer, MD  Date of Admission: 05/07/2020  5:30 AM Date of Discharge: 05/10/2020 Attending Physician: Velna Ochs, MD  Discharge Diagnosis: 1. Acute ischemic stroke secondary to a cardio-embolism 2. Atrial fibrillation   Discharge Medications: Allergies as of 05/10/2020   No Known Allergies     Medication List    STOP taking these medications   aspirin 81 MG EC tablet   calcium carbonate 1500 (600 Ca) MG Tabs tablet Commonly known as: OSCAL   cetirizine 10 MG tablet Commonly known as: ZYRTEC   digoxin 0.125 MG tablet Commonly known as: LANOXIN   Fish Oil 1000 MG Cpdr   metoprolol succinate 25 MG 24 hr tablet Commonly known as: TOPROL-XL   multivitamin with minerals Tabs tablet   simvastatin 40 MG tablet Commonly known as: ZOCOR   vitamin C 1000 MG tablet   Vitamin D 50 MCG (2000 UT) Caps     TAKE these medications   acetaminophen 500 MG tablet Commonly known as: TYLENOL Take 500 mg by mouth every 6 (six) hours as needed for moderate pain. What changed: Another medication with the same name was added. Make sure you understand how and when to take each.   acetaminophen 650 MG suppository Commonly known as: TYLENOL Place 1 suppository (650 mg total) rectally every 4 (four) hours as needed for mild pain (or temp > 37.5 C (99.5 F)). What changed: You were already taking a medication with the same name, and this prescription was added. Make sure you understand how and when to take each.   bisacodyl 10 MG suppository Commonly known as: DULCOLAX Place 1 suppository (10 mg total) rectally daily as needed (Constipation).   glycopyrrolate 1 MG tablet Commonly known as: ROBINUL Take 1 tablet (1 mg total) by mouth every 4 (four) hours as needed (excessive secretions).   glycopyrrolate 0.2 MG/ML injection Commonly known as: ROBINUL Inject 1 mL (0.2 mg total) into the skin every 4  (four) hours as needed (excessive secretions).   LORazepam 2 MG/ML concentrated solution Commonly known as: ATIVAN Place 0.5 mLs (1 mg total) under the tongue every 4 (four) hours as needed for anxiety.   morphine CONCENTRATE 10 MG/0.5ML Soln concentrated solution Take 0.25 mLs (5 mg total) by mouth every 2 (two) hours as needed for moderate pain (or dyspnea).   morphine CONCENTRATE 10 MG/0.5ML Soln concentrated solution Place 0.25 mLs (5 mg total) under the tongue every 2 (two) hours as needed for moderate pain (or dyspnea).   mouth rinse Liqd solution 15 mLs by Mouth Rinse route 2 (two) times daily.   OLANZapine zydis 5 MG disintegrating tablet Commonly known as: ZYPREXA Take 1 tablet (5 mg total) by mouth daily as needed (agitation).   ondansetron 4 MG disintegrating tablet Commonly known as: ZOFRAN-ODT Take 1 tablet (4 mg total) by mouth every 6 (six) hours as needed for nausea.   scopolamine 1 MG/3DAYS Commonly known as: TRANSDERM-SCOP Place 1 patch (1.5 mg total) onto the skin every 3 (three) days. Start taking on: May 12, 2020   Systane Balance 0.6 % Soln Generic drug: Propylene Glycol Place 1 drop into both eyes daily as needed (dry eyes).       Disposition and follow-up:   Colton Bonilla was discharged from [facility] in Serious condition to inpatient hospice.  At the hospital follow up visit please address:  Follow-up Appointments:  Follow-up Information    [facility] Follow up in 4 week(s).   Specialty: Neurology Why: stroke clinic. office will call with appt date and time Contact information: [address] [place] by problem list: 1. Acute ischemic stroke secondary to a cardioembolism Colton Bonilla was admitted to the [facility] on 05/07/2020 with symptoms of slurred speech and left facial droop.  Initial CT head  without contrast was negative for acute findings, but did show a late subacute chronic left parieto-occipital infarct with petechial hemorrhage.  CTA head and neck negative for large vessel occlusion.  MRI obtained the next day showed evidence of a large 4 x 3 x 5 cm cortical infarct in the right frontal lobe and right frontal upper operculum with evidence of petechial hemorrhage as well.  There is no significant mass-effect.  Etiology of patient's current stroke, and his previous strokes in the most recent months, is likely due to cardioembolism due to his history of A. fib without anticoagulation.  Unfortunately patient's swallowing ability was significantly affected by this most recent stroke.  Patient was having difficulty clearing his own airway and required frequent suctioning.  NG tube was placed to assist with feeding.  Due to concern patient's neurological deficits will not improve, palliative care was consulted and the decision was made to move towards comfort care.  Patient will be transferred to an inpatient hospice center.  2. Atrial fibrillation  Colton Bonilla has a long-term history of permanent A. fib and was at one point on Pradaxa.  Approximately 5 years ago he experienced a GI bleed that required hospitalization he was instructed to DC any future anticoagulation use.  On admission, patient continued to be in A. fib with heart rate fluctuating in the low 100s to 130s.  His home rate control medications including metoprolol were needed to be held due to permissive hypertension with a stroke.  Patient denied any symptoms of chest pain or palpitations due to A. fib.  Discharge Vitals:   BP 110/82 (BP Location: Right Arm)   Pulse 97   Temp 98.7 F (37.1 C) (Axillary)   Resp 18   Ht 5\' 11"  (1.803 m)   Wt 71.1 kg   SpO2 100%   BMI 21.86 kg/m   Pertinent Labs, Studies, and Procedures:   BMP Latest Ref Rng & Units 05/10/2020 05/09/2020 05/08/2020  Glucose 70 - 99 mg/dL 186(H) 130(H) 148(H)    BUN 8 - 23 mg/dL 18 16 17   Creatinine 0.61 - 1.24 mg/dL 0.99 1.12 1.04  Sodium 135 - 145 mmol/L 135 139 139  Potassium 3.5 - 5.1 mmol/L 4.0 4.1 4.0  Chloride 98 - 111 mmol/L 102 102 101  CO2 22 - 32 mmol/L 24 24 25   Calcium 8.9 - 10.3 mg/dL 8.9 9.4 9.6   CBC Latest Ref Rng & Units 05/10/2020 05/09/2020 05/08/2020  WBC 4.0 - 10.5 K/uL 6.3 7.6 10.1  Hemoglobin 13.0 - 17.0 g/dL 14.8 15.6 15.8  Hematocrit 39 - 52 % 43.5 44.7 46.8  Platelets 150 - 400 K/uL 141(L) 146(L) 150   MRI Brain (05/08/2020)  IMPRESSION: Motion degraded exam.  4.1 x 3.3 x 5.5 cm cortically based infarct within the posterolateral right frontal lobe and right frontal operculum (this includes involvement of the lateral right precentral gyrus). Redemonstrated petechial hemorrhage at this site. No significant mass effect at this time.  5 mm acute cortical infarct within the high left parietal  lobe.  Redemonstrated late subacute to chronic left parietooccipital cortical/subcortical infarct with cortical laminar necrosis and/or non-acute blood products at this site.  Otherwise stable MRI appearance of the brain as compared to 04/03/2020 with additional small chronic cortical and cerebellar infarcts, mild atrophy and moderate chronic small vessel ischemic Disease.  CTA Head/Neck (05/07/2020)  IMPRESSION: 1. No emergent large vessel occlusion. 2. Intracranial atherosclerosis most notably affecting the posterior circulation where there is 50% right V4 and high-grade proximal basilar stenoses. 3. Cervical atherosclerosis without flow limiting stenosis.  Discharge Instructions: Discharge Instructions    Ambulatory referral to Neurology   Complete by: As directed    Follow up in stroke clinic at [facility] with Frann Rider, NP in about 4 weeks following discharge. If not available, consider Dr. Antony Contras, Dr. Bess Harvest, or Dr. Sarina Ill.   Discharge instructions   Complete by:  As directed    Colton Bonilla was admitted to the [facility] after an acute ischemic stroke that caused significant trouble swallowing, speaking and moving his extremities.  Palliative care was consulted and after a long conversation with his family, the decision was made to move towards comfort care.  Colton Bonilla will be transferred to an inpatient hospice center at this time.   Increase activity slowly   Complete by: As directed      Signed: Dr. Jose Persia Internal Medicine PGY-2  Pager: [phone] After 5pm on weekdays and 1pm on weekends: On Call pager [phone]  05/10/2020, 3:27 PM

## 2020-05-10 NOTE — TOC Initial Note (Signed)
Transition of Care [facility]) - Initial/Assessment Note    Patient Details  Name: Colton Bonilla MRN: [id number] Date of Birth: 28-Feb-1930  Transition of Care [facility]) CM/SW Contact:    Bartholomew Crews, RN Phone Number: [phone] 05/10/2020, 11:04 AM  Clinical Narrative:                  Notified by palliative care, Sharyn Lull, of need for inpatient hospice in [place]. Spoke with spouse and family members at bedside. Confirmed desire for inpatient hospice in [place]. Family contact will be patient's daughter, Tedra [place] [phone]. Spoke with Charleston Ropes at [place] to provide referral for [facility]. TOC following for transition needs.   Expected Discharge Plan: [place] Barriers to Discharge: Hospice Bed not available (pending inpatient hospice acceptance and bed availability)   Patient Goals and CMS Choice Patient states their goals for this hospitalization and ongoing recovery are:: inpatient hospice in [facility] Medicare.gov Compare Post Acute Care list provided to:: Patient Represenative (must comment) (spouse, [name]) Choice offered to / list presented to : Spouse  Expected Discharge Plan and Services Expected Discharge Plan: [place] In-house Referral: Hospice / [place], Clinical Social Work Discharge Planning Services: CM Consult Post Acute Care Choice: Hospice Living arrangements for the past 2 months: Single Family Home                 DME Arranged: N/A DME Agency: NA       HH Arranged: NA [place] Agency: NA        Prior Living Arrangements/Services Living arrangements for the past 2 months: [place] with:: Self, Spouse Patient language and need for interpreter reviewed:: Yes              Criminal Activity/Legal Involvement Pertinent to Current Situation/Hospitalization: No - Comment as needed  Activities of Daily Living Home Assistive Devices/Equipment: Cane (specify quad  or straight), Walker (specify type), Shower chair with back ADL Screening (condition at time of admission) Patient's cognitive ability adequate to safely complete daily activities?: No Is the patient deaf or have difficulty hearing?: Yes Does the patient have difficulty seeing, even when wearing glasses/contacts?: Yes Does the patient have difficulty concentrating, remembering, or making decisions?: Yes Patient able to express need for assistance with ADLs?: No Does the patient have difficulty dressing or bathing?: Yes Independently performs ADLs?: No Communication: Independent (slurred speech/dysarthria) Dressing (OT): Dependent Is this a change from baseline?: Change from baseline, expected to last >3 days Grooming: Dependent Is this a change from baseline?: Change from baseline, expected to last >3 days Feeding: Dependent Is this a change from baseline?: Change from baseline, expected to last >3 days Bathing: Dependent Is this a change from baseline?: Change from baseline, expected to last >3 days Toileting: Dependent Is this a change from baseline?: Change from baseline, expected to last >3days In/Out Bed: Dependent Is this a change from baseline?: Change from baseline, expected to last >3 days Walks in Home: Dependent Is this a change from baseline?: Change from baseline, expected to last >3 days Does the patient have difficulty walking or climbing stairs?: Yes Weakness of Legs: Both Weakness of Arms/Hands: Left  Permission Sought/Granted                  Emotional Assessment         Alcohol / Substance Use: Not Applicable Psych Involvement: No (comment)  Admission diagnosis:  CVA (cerebral vascular accident) ([place]) [I63.9] Acute ischemic  stroke [facility]) [I63.9] Patient Active Problem List   Diagnosis Date Noted  . CVA (cerebral vascular accident) ([place]) 05/07/2020  . Bleeding from varicose vein 01/29/2014  . A-fib ([place])   . Prostate cancer ([place])   . Dehydration  01/28/2014   PCP:  Myer Peer, MD Pharmacy:   [facility] [address] [place] [address] Phone: [phone] Fax: [phone]     Social Determinants of Health (SDOH) Interventions    Readmission Risk Interventions No flowsheet data found.

## 2020-05-10 NOTE — Progress Notes (Signed)
STROKE TEAM PROGRESS NOTE   INTERVAL HISTORY There are multiple family members in the room including patient's mother wife.  I had a long conversation with them regarding patient's condition, prognosis and treatment plan and answered questions.  Vital signs are stable.  Vitals:   05/10/20 0200 05/10/20 0338 05/10/20 0359 05/10/20 0745  BP: 111/69 (!) 124/92  110/82  Pulse: (!) 101 (!) 102  97  Resp: 13 18  18   Temp:  98.2 F (36.8 C)  98.7 F (37.1 C)  TempSrc:  Oral  Axillary  SpO2: 100% 100%  100%  Weight:   71.1 kg   Height:       CBC:  Recent Labs  Lab 05/07/20 0607 05/07/20 0607 05/08/20 0739 05/08/20 0739 05/09/20 0121 05/10/20 0222  WBC 5.4   < > 10.1   < > 7.6 6.3  NEUTROABS 3.2  --  8.0*  --   --   --   HGB 14.9   < > 15.8   < > 15.6 14.8  HCT 46.1   < > 46.8   < > 44.7 43.5  MCV 97.9   < > 93.6   < > 93.5 94.0  PLT 140*   < > 150   < > 146* 141*   < > = values in this interval not displayed.   Basic Metabolic Panel:  Recent Labs  Lab 05/09/20 0121 05/10/20 0222  NA 139 135  K 4.1 4.0  CL 102 102  CO2 24 24  GLUCOSE 130* 186*  BUN 16 18  CREATININE 1.12 0.99  CALCIUM 9.4 8.9   Lipid Panel:  Recent Labs  Lab 05/07/20 0607  CHOL 88  TRIG 145  HDL 31*  CHOLHDL 2.8  VLDL 29  LDLCALC 28   HgbA1c:  Recent Labs  Lab 05/07/20 0607  HGBA1C 6.5*   Urine Drug Screen:  Recent Labs  Lab 05/07/20 1115  LABOPIA NONE DETECTED  COCAINSCRNUR NONE DETECTED  LABBENZ NONE DETECTED  AMPHETMU NONE DETECTED  THCU NONE DETECTED  LABBARB NONE DETECTED    Alcohol Level  Recent Labs  Lab 05/07/20 0531  ETH <10    IMAGING past 24 hours No results found.  PHYSICAL EXAM   Temp:  [97.8 F (36.6 C)-99 F (37.2 C)] 98.7 F (37.1 C) (12/04 0745) Pulse Rate:  [97-127] 97 (12/04 0745) Resp:  [13-20] 18 (12/04 0745) BP: (100-143)/(67-94) 110/82 (12/04 0745) SpO2:  [99 %-100 %] 100 % (12/04 0745) Weight:  [71.1 kg] 71.1 kg (12/04 0359)  General -  Well nourished, well developed, elderly Caucasian male not in distress. Ophthalmologic - fundi not visualized due to noncooperation.  Cardiovascular - irregularly irregular heart rate and rhythm with intermittent RVR.  Neuro - lethargic, but responds to tactile stimuli, able to open eyes on voice, severe dysarthria and hypophonia, orientated to people and place but not to time or age. Able to follow simple commands. Not cooperative on naming or repeating. Right gaze preference, barely cross midline. Not blinking to visual threat bilaterally. PERRL. Right facial droop. Tongue protrusion not cooperative. LUE mild drift with decreased finger grip, RUE 4/5 at least. RLE 4/5 with knee flexion, LLE 3+/5 proximal with knee flexion and 4/5 distally. Sensation subjectively symmetrical. FTN not cooperative. Gait not tested.   ASSESSMENT/PLAN Mr. Damone Fancher is a 86 y.o. male with history of atrial fibrillation (unclear if on anticoagulation), hypertension, hyperlipidemia, coronary artery disease, prostate cancer presenting with  left facial droop, severe dysarthria, and potentially  worsened left-sided weakness..   Stroke:  Moderate right frontal lobe infarcts and punctate L parietal lobe infarct, embolic secondary to AF not on [facility]  Code Stroke CT head No acute abnormality. Subacute to chronic L parieto-occipital infarct w/ petechial hemorrhage. Small vessel disease. ASPECTS 10.     CTA head & neck no ELVO. RV4 50% stenosis. High-grade proximal BA stenosis. Cervical atherosclerosis   Repeat CT head w/ neuro worsening (reported agitated, delirious, unable to move L arm or leg; MD exam unchanged) - evolving lateral R precentral gyrus infarct w/ petechial hemorrhage    EEG  Continuous slowing, no sz  MRI w/ ativan  Posterior R frontal lobe and R frontal opercular infarct. High L parietal lobe punctate infarct. Chronic L parieto-occipital cortical/subcortical infarcts.   2D Echo EF 55-60%  LDL  28  HgbA1c 6.5  VTE prophylaxis - Lovenox 40 mg sq daily   aspirin 81 mg daily prior to admission, now on ASA 300 PR. Given size of infarct and petechial hemorrhage seen on CT, will consider NOAC in 10-14 days post stroke   Therapy recommendations: pt is now comfort care  Disposition:  pending   Palliative care consult placed for [place] discussion  Atrial Fibrillation w/ RVR  Home anticoagulation:  none    hx LE bleeding from varicose vein in 2015, presented to ED. stable;   Per wife, pt had stroke in 2004 and found to have afib. Put on pradaxa, however, pt had LGIB vs. Hematuria after that, and he was taken off pradaxa and put on ASA. . Will rechallenge with NOAC 10-14 days post stroke  . Wife is in agreement, wife does not want pradaxa this time   History of stroke  Per wife, stroke 2004, found to have A. fib, on Pradaxa, but later on stopped due to LGIB vs. Hematuria  Recent stroke on 03/28/2020 with right hemianopia.  Admitted in [facility], details not clear.  However, by reviewing images,04/03/2020 CT head showed subacute infarct and petechial hemorrhage involving the left parietal lobe.  Same-day MRI showed 3 mm acute infarct within the right frontal lobe white matter.  Late subacute infarct within the left parietal lobe.  Chronic right frontal paramedian, and right basal ganglia lacunar infarcts.  Small chronic infarct within the right cerebellum.  Carotid Doppler negative.  Repeat CT 04/10/2020 showed unchanged late subacute left parietal infarct with cortical laminar necrosis.  Per wife, he was put on low dose eliquis but then his PCP put him back on ASA due to petechial hemorrhage.  Hypertension  Stable on the low end . Permissive hypertension (OK if < 180/105 due to petechial hemorrahge) but gradually normalize in 3-5 days . Long-term BP goal normotensive  Hyperlipidemia  Home meds:  zocor 40 and fish oil, resumed in hospital  LDL 28, goal < 70  Decrease Zocor  to 20 given low LDL (moderate/intensive statin not recommended)  Continue statin at discharge  Dysphagia . Secondary to stroke . NPO . off IV fluid . Cortrak w/ TF . Resume home medication . Speech on board  [place] - 05/10/20 SUMMARY OF RECOMMENDATIONS  DNAR/DNI  Comfort care - pt is now comfort care  Liberalize visitation policy  [place] home [facility]) - if patient maintains stability  Other Stroke Risk Factors  Advanced Age >/= 84   Other Active Problems  Hx prostate cancer  Hx testicular cancer  AAA 4.6 at C5 in 06/2019, stable [facility] day #  3 Agree with plan for full comfort care and transferred to hospice skilled nursing facility later today when bed available.  Long discussion with patient's wife and multiple family members at the bedside and answered questions.  Stroke team will sign off.  Kindly call for questions.  Greater than 50% time during this 25-minute visit was spent on counseling and coordination of care about his embolic stroke and neurological prognosis and answering questions. Antony Contras, MD  To contact Stroke Continuity provider, please refer to [email or web address] After hours, contact General Neurology

## 2020-05-10 NOTE — Consult Note (Signed)
Palliative Medicine Inpatient Consult Note  Reason for consult:  Goals of Care Discussion  HPI:  Per intake H&P --> [name] a 86 y.o.malewith history of atrial fibrillation (not onanticoagulation), hypertension, hyperlipidemia, coronary artery disease, prostate cancerpresenting with left facial droop, severe dysarthria, and potentially worsened left-sided weakness.  Palliative care was asked to get involved to aid in goals of care conversations.  Clinical Assessment/Goals of Care: I have reviewed medical records including EPIC notes, labs and imaging, received report from bedside RN, assessed the patient who was yelling out in bed that he is ready to go home.  When asked what he meant he shared "he is ready to meet his heavenly father".  Per discussion with nursing he has been saying this for the last day   I met with patient's wife Colton Bonilla to further discuss diagnosis prognosis, GOC, EOL wishes, disposition and options.   I introduced Palliative Medicine as specialized medical care for people living with serious illness. It focuses on providing relief from the symptoms and stress of a serious illness. The goal is to improve quality of life for both the patient and the family.  Colton Bonilla shares with me that she and Colton Bonilla this have been married for the past 11 years. They have 2 children together a son, Colton Bonilla and a daughter, Colton Bonilla.  They share 2 grandsons one great grandson and one great granddaughter.  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  He retired 3 years ago.  They get great joy out of spending time with one another.  Colton Bonilla this is a faithful man and a member of [employer].  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  From a functional perspective Colton Bonilla was fully independent of basic activities of daily living and instrumental  activities of daily living until the stroke.   A detailed discussion was had today regarding advanced directives Colton Bonilla shares that they do have a living will which states no extraneous measures to preserve life.    Concepts specific to code status, artifical feeding and hydration, continued IV antibiotics and rehospitalization was had. I completed a MOST form today. The patient and family outlined their wishes for the following treatment decisions:  Cardiopulmonary Resuscitation: Do Not Attempt Resuscitation (DNR/No CPR)  Medical Interventions: Limited Additional Interventions: Use medical treatment, IV fluids and cardiac monitoring as indicated, DO NOT USE intubation or mechanical ventilation. May consider use of less invasive airway support such as BiPAP or CPAP. Also provide comfort measures. Transfer to the [facility] if indicated. Avoid intensive care.   Antibiotics: Determine use of limitation of antibiotics when infection occurs  IV Fluids: IV fluids for a defined trial period  Feeding Tube: Feeding tube for a defined trial period   We talked in detail about the stroke that Colton Bonilla is has suffered in the fear that perhaps he will not recover from this.  I shared with [facility] that he did have an episode overnight of what appears to be mucous plugging/aspiration.  I shared with her that I do not anticipate this is something that will improve greatly in the next few days.  We also reviewed that having a core track still places the patient at high risk for recurrent aspirational events.  We discussed the mechanical component in terms of his ability to swallow and his inability at this juncture to properly  1 protect his airway and to allow clearance of food.  It does appear at this juncture that stroke is likely to lead to his final demise.  Colton Bonilla had a very very difficult time coming to grips with this reality.  I shared with Colton Bonilla that we have 1 of 2 choices here.  We can either continue what we are  doing which does seem to be causing him a degree of suffering.  I proposed another avenue that could be traveled which is allowing the medical team to manage his symptoms aggressively so that he could pass away with peace and dignity.  I shared with Colton Bonilla that the patient continues to wish "to go home."  Colton Bonilla shares that she knows he is ready because she is asked him multiple times but that does not make it any less difficult for her.  Provided a great deal of time for [facility] to express her emotions.  Utilized reflective listening as a form of emotional support.  Given the degree of emotional strain Colton Bonilla was experiencing I recommended we take a few minutes and then resume conversation with the involvement of her daughter, Colton Bonilla.  The difference between a aggressive medical intervention path  and a palliative comfort care path for this patient at this time was had. Values and goals of care important to patient and family were attempted to be elicited  Discussed the importance of continued conversation with family and their  medical providers regarding overall plan of care and treatment options, ensuring decisions are within the context of the patients values and GOCs. ____________________________________________________ AddendumDaylene Bonilla and I were able to call Colton Bonilla on speaker phone.  I reviewed and summarized the conversation I had had with Colton Bonilla I shared with Colton Bonilla that I am very concerned that the stroke that Colton Bonilla is has suffered will likely lead to the end of his journey on earth.  Colton Bonilla was very much receptive to this and in agreements with this.  Colton Bonilla shares that she spoke to her father yesterday and he recurrently said that he wanted to go home when she clarified he had said his "eternal home".  I took the time to describe comfort measures to both [place] and her daughter Colton Bonilla. We talked about transition to comfort measures in house and what that would entail inclusive of medications to  control pain, dyspnea, agitation, nausea, itching, and hiccups.   We discussed stopping all uneccessary measures such as blood draws, needle sticks, and frequent vital signs.   Colton Bonilla agrees that this is the best thing for her father as she feels like he has "suffered enough".  I spoke to patient's bedside RN Greta.  We went to bedside and discontinued telemetry monitoring.  We also stop tube feeding.  We will hold off on removing the nasogastric tube until Colton Bonilla is able to come to bedside.  Requested for immediate chaplaincy support given the amount of distress Colton Bonilla is suffering.  I was also able to call [employer] to request for her pastor to come in.  I have reached out to transitions of care team to discuss transition to an inpatient hospice in [place] if patient maintains stability.  Decision Maker: Jerol Rufener (spouse) - [phone]  SUMMARY OF RECOMMENDATIONS   DNAR/DNI  Comfort care  Liberalize visitation policy  [place] [facility] ([place]) - if patient maintains stability  Code Status/Advance Care Planning: DNAR/DNI   Symptom Management:  Dyspnea: Pain:  - Morphine 2-41m IV Q1H PRN Fever:  -  Tylenol 6449m PO/PR Q6H PRN Agitation:  - Haldol 265mIV Q6H PRN Anxiety:  - Lorazepam 49m249mV  Q1H PRN Nausea:  - Zofran 4mg13m/IV Q6H PRN  Secretions:  - Glycopyrrolate 0.4mg 59mQ4H ATC  - Scop Patch Dry Eyes:  - Artificial Tears PRN Xerostomia:  - Biotene 15ml 62m - BID oral care Urinary Retention:  - Bladder scan if > 250ml p58m and maintain foley  Constipation:  - Bisacodyl 10mg PR1049m QDay Spiritual:  - Chaplain consult   Palliative Prophylaxis:   Oral Care, Turn Q2H, Pain, Delirium  Additional Recommendations (Limitations, Scope, Preferences):  Comfort Care   Psycho-social/Spiritual:   Desire for further Chaplaincy support: Yes  Additional Recommendations: Education on death and dying process   Prognosis:  Poor - likely days  Discharge Planning: Discharge will be either [facility] or to inpatient hospice  Vitals:   05/10/20 0200 05/10/20 0338  BP: 111/69 (!) 124/92  Pulse: (!) 101 (!) 102  Resp: 13 18  Temp:  98.2 F (36.8 C)  SpO2: 100% 100%    Intake/Output Summary (Last 24 hours) at 05/10/2020 0655 [id number] filed at 05/09/2020 1810 Gross per 24 hour  Intake 36 ml  Output 550 ml  Net -514 ml   Last Weight  Most recent update: 05/10/2020  4:00 AM   Weight  71.1 kg (156 lb 12 oz)           Gen:  Eldely M - yelling out HEENT: Dry mucous membranes, coretrack in place CV: Irregular rate and rhythm  PULM: On 0.5LPM [place], diminished ABD: soft/nontender  EXT: No edema, (+) ecchymosis in UE Neuro: Disoriented - yelling out  PPS: 10%   This conversation/these recommendations were discussed with patient primary care team, Dr. GuilloudPhilipp Ovensn: 0840 Time Out: 1040 Total Time: 120 Greater than 50%  of this time was spent counseling and coordinating care related to the above assessment and plan.  [place] Cell Phone: [phone] secure chat with additional questions, if there is no response within 30 minutes please call the above phone number  Palliative Medicine Team providers are available by phone from 7am to 7pm daily and can be reached through the team cell phone.  Should this patient require assistance outside of these hours, please call the patient's attending physician.

## 2020-05-10 NOTE — Progress Notes (Signed)
   05/10/20 1000  Clinical Encounter Type  Visited With Patient and family together  Visit Type Patient actively dying  Referral From Nurse  Consult/Referral To Chaplain  Spiritual Encounters  Spiritual Needs Prayer;Emotional  Stress Factors  Patient Stress Factors Major life changes  Family Stress Factors Loss;Major life changes  Chaplain visited patient and his wife at request of nurse. Patient actively dying. Wife very emotional. They are waiting for daughter and son to arrive. Chaplain is available when needed.

## 2020-05-10 NOTE — Progress Notes (Signed)
Patient still waiting for PTAR to transport to hospice facility.

## 2020-05-10 NOTE — Progress Notes (Signed)
   Subjective:   Patient is resting comfortably this morning.  Many family members are surrounding his bed, including his daughter and wife.  They expressed to me that they are moving towards comfort care.  I emphasized that we will focus our efforts on to making Colton Bonilla comfortable and will do our best to make sure he is not suffering.  Objective:  Vital signs in last 24 hours: Vitals:   05/10/20 0200 05/10/20 0338 05/10/20 0359 05/10/20 0745  BP: 111/69 (!) 124/92  110/82  Pulse: (!) 101 (!) 102  97  Resp: 13 18  18   Temp:  98.2 F (36.8 C)  98.7 F (37.1 C)  TempSrc:  Oral  Axillary  SpO2: 100% 100%  100%  Weight:   71.1 kg   Height:       Physical Exam Vitals and nursing note reviewed.  Constitutional:      General: He is sleeping.     Appearance: He is normal weight.     Comments: Resting comfortably  Pulmonary:     Effort: Pulmonary effort is normal. No respiratory distress.     Breath sounds: No stridor.    Assessment/Plan:  Active Problems:   CVA (cerebral vascular accident) ([place])  Colton Cheshireis a 86 y.o.malewith history of atrial fibrillation (not onanticoagulation), hypertension, hyperlipidemia, coronary artery disease, prostate canceradmitted for acute CVA.   # Acute Ischemic CVA Over the past 24 hours, patient had a cortrak placed and started tube feeds.  There is a great concern that patient's condition is not reversible, and he will have permanent dysarthria and dysphagia placing him at very high risk of aspiration.    Due to this, palliative care was consulted yesterday.  This morning, palliative care was able to meet with the patient's wife, daughter, and the patient.  The decision was made to move towards comfort care and transition to an inpatient hospice center.  At this time, will discontinue all life prolonging measures.  - Comfort care only  # Left eye Bacterial Conjunctivitis  - Discontinue drops   # Hyperlipidemia  -  Discontinue statin  Prior to Admission Living Arrangement: Home  Anticipated Discharge Location: Hospice  Barriers to Discharge: None  Dispo: Anticipated discharge today.  Dr. Jose Persia Internal Medicine PGY-2  Pager: [phone] After 5pm on weekdays and 1pm on weekends: On Call pager [phone]  05/10/2020, 7:57 AM

## 2020-05-10 NOTE — TOC Transition Note (Addendum)
Transition of Care [facility]) - CM/SW Discharge Note   Patient Details  Name: Bryker Fletchall MRN: [id number] Date of Birth: 06/06/1930  [facility] CM/SW Contact:  Bartholomew Crews, RN Phone Number: [phone] 05/10/2020, 2:47 PM   Clinical Narrative:     Notified by Charleston Ropes at [facility] that patient has a bed for today. MD notified of bed available. DNR on chart. PTAR arranged. Medical transport paperwork on the chart. Family aware of transport. Notified Daisy of transport arrangements.   Nurse to call report to [place] at [phone].    No further TOC needs identified.   Final next level of care: [place] Barriers to Discharge: No Barriers Identified   Patient Goals and CMS Choice Patient states their goals for this hospitalization and ongoing recovery are:: [facility].gov Compare Post Acute Care list provided to:: Patient Represenative (must comment) (spouse) Choice offered to / list presented to : Spouse  Discharge Placement                Patient to be transferred to facility by: PTAR [facility] Patient and family notified of of transfer: 05/10/20  Discharge Plan and Services In-house Referral: Hospice / [place], Clinical Social Work Discharge Planning Services: CM Consult Post Acute Care Choice: Hospice          DME Arranged: N/A DME Agency: NA       HH Arranged: NA [place] Agency: NA        Social Determinants of Health (SDOH) Interventions     Readmission Risk Interventions No flowsheet data found.

## 2020-05-10 NOTE — TOC Progression Note (Signed)
Transition of Care [facility]) - Progression Note    Patient Details  Name: Colton Bonilla MRN: [id number] Date of Birth: 1930/03/26  Transition of Care [facility]) CM/SW Contact  Elliot Gurney [place], [place] Phone Number: [phone] 05/10/2020, 11:23 AM  Clinical Narrative:    Available clinicals and demographic information faxed to Renato Gails at hospice of the [facility] for review [phone].   [employer], LCSW Transition of Care [phone]    Expected Discharge Plan: [place] Barriers to Discharge: Hospice Bed not available (pending inpatient hospice acceptance and bed availability)  Expected Discharge Plan and Services Expected Discharge Plan: [place] In-house Referral: Hospice / Palliative Care, Clinical Social Work Discharge Planning Services: CM Consult Post Acute Care Choice: Hospice Living arrangements for the past 2 months: Single Family Home                 DME Arranged: N/A DME Agency: NA       HH Arranged: NA HH Agency: NA         Social Determinants of Health (SDOH) Interventions    Readmission Risk Interventions No flowsheet data found.

## 2020-05-10 NOTE — Progress Notes (Signed)
Patient left to hospice facility via PTAR.

## 2020-05-10 NOTE — Progress Notes (Signed)
Pt with frequent occurences of HR 130-150BPM, message sent and return call received from Dr. Court Joy with IM, MD aware of elevated HR. No new orders received at this time. Will continue to monitor

## 2020-05-10 NOTE — Progress Notes (Signed)
Patient was discharged to [facility]ospice at [facility] by MD order; discharged instructions review and sent to facility with care notes, IV DIC; skin intact; facility was called and report was given to the nurse who is going to receive the patient; NGT was removed per order; patient will be transported to facility via [place].

## 2020-06-07 DEATH — deceased

## 2021-04-13 IMAGING — CT CT HEAD W/O CM
4 series · 16 of 47 positions shown, 18 images · non-contrast
Comparison: Earlier same day

CLINICAL DATA: Stroke, follow-up

EXAM:
CT HEAD WITHOUT CONTRAST
TECHNIQUE: Contiguous axial images were obtained from the base of the skull
through the vertex without intravenous contrast.

[Series 3: head wo · axial · 0.51mm/px · z∈[-156,-26]mm · 7 of 36 slices shown, 9 images]
[im 5/36  brain]
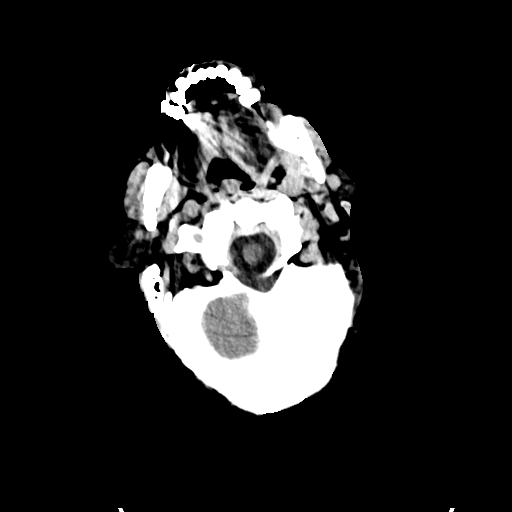
[im 5/36  bone]
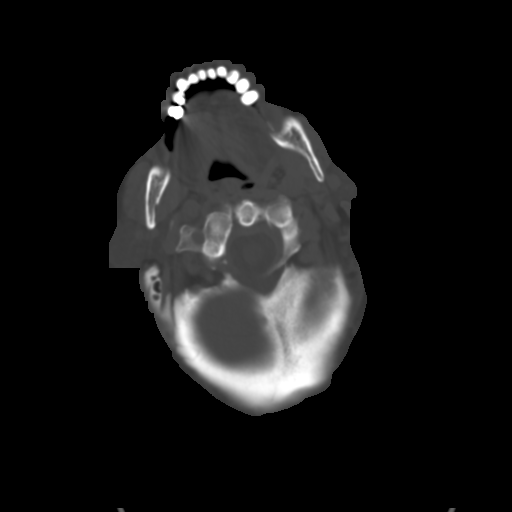
[im 9/36  brain]
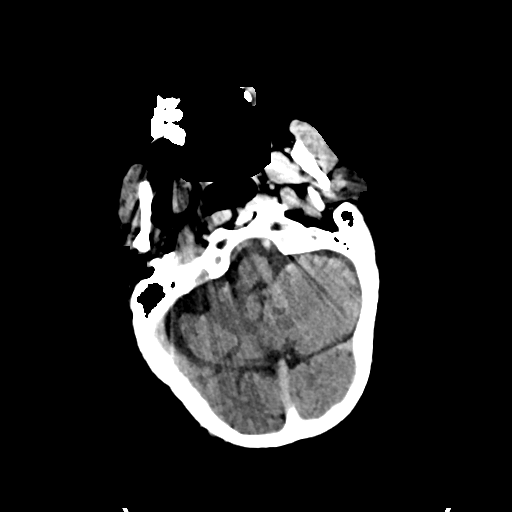
[im 14/36  brain]
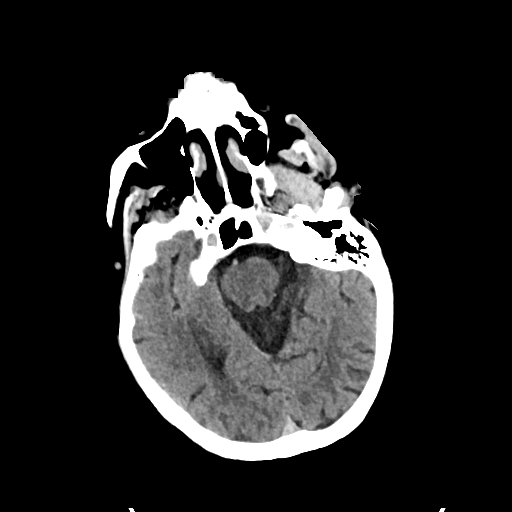
[im 18/36  brain]
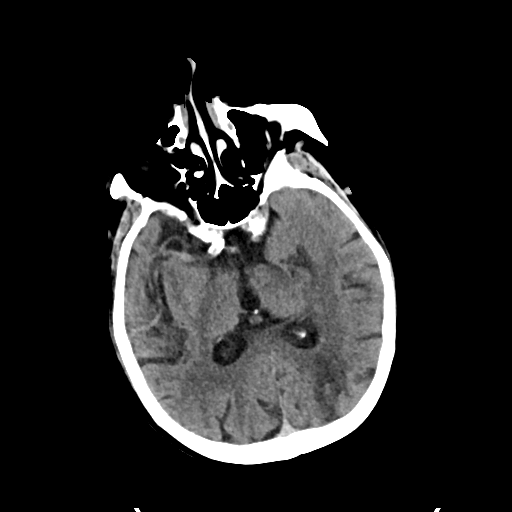
[im 22/36  brain]
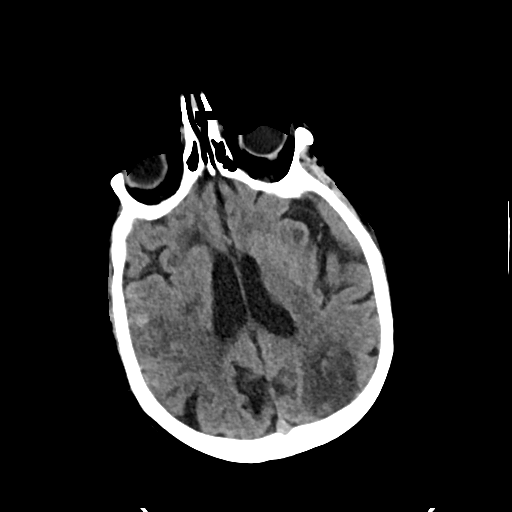
[im 22/36  bone]
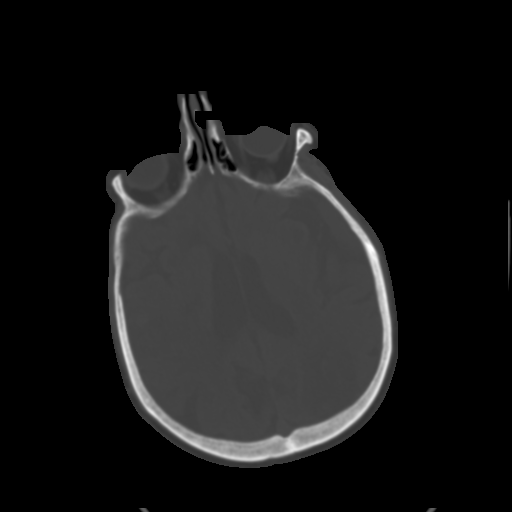
[im 27/36  brain]
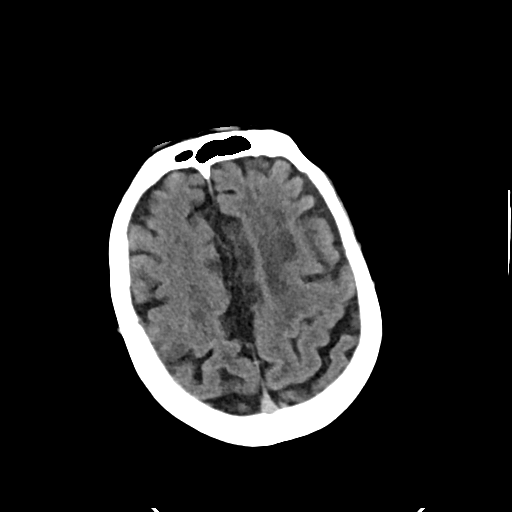
[im 31/36  brain]
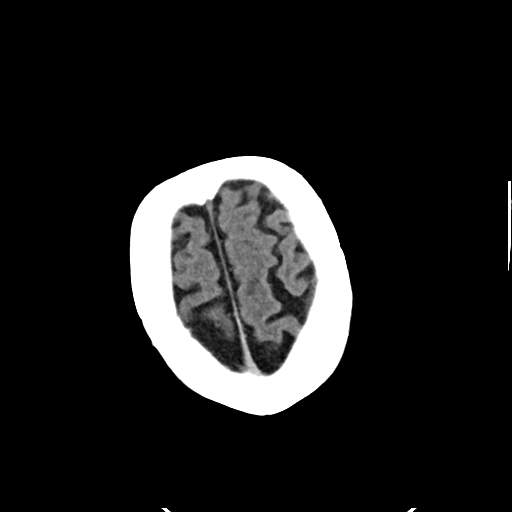

[Series 4: head bone · axial · 0.51mm/px · z∈[-160,-124]mm · 3 of 90 slices shown]
[im 9/90  bone]
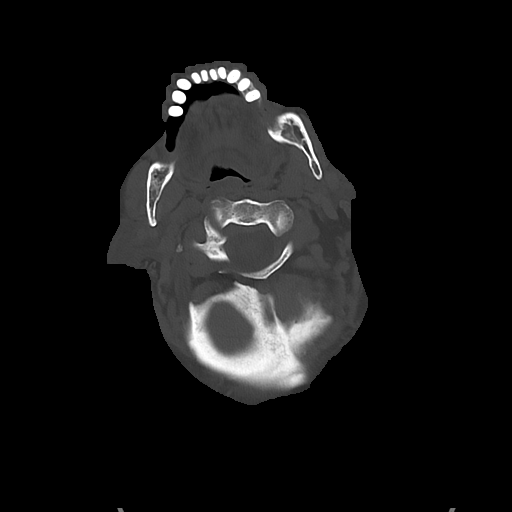
[im 18/90  bone]
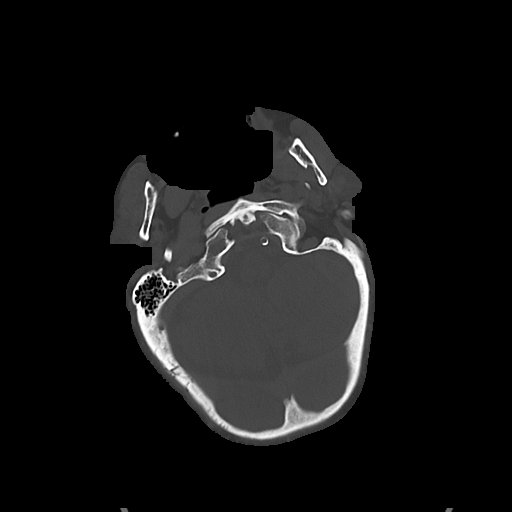
[im 27/90  bone]
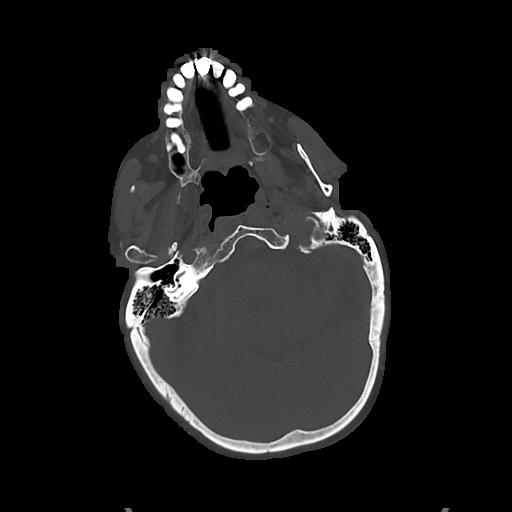

[Series 5: cor soft · coronal · 0.34mm/px · 3 of 75 slices shown]
[im 28/75  brain]
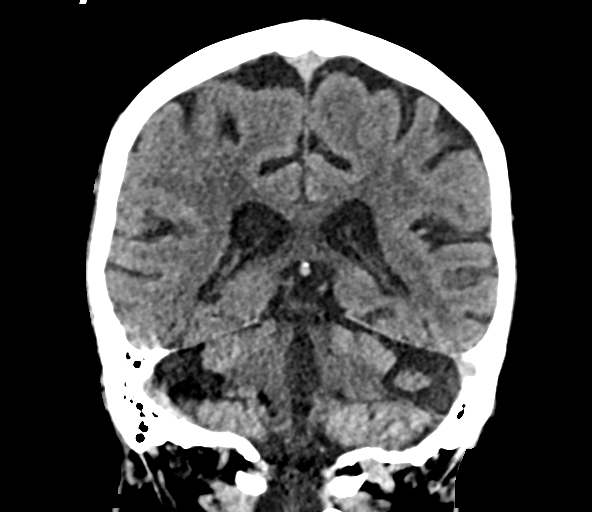
[im 34/75  brain]
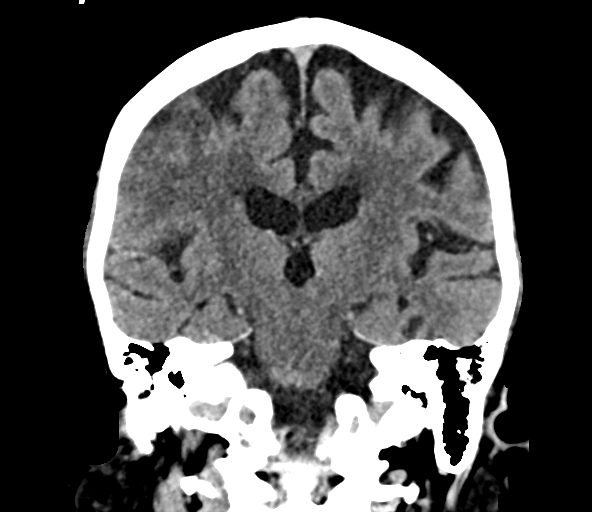
[im 40/75  brain]
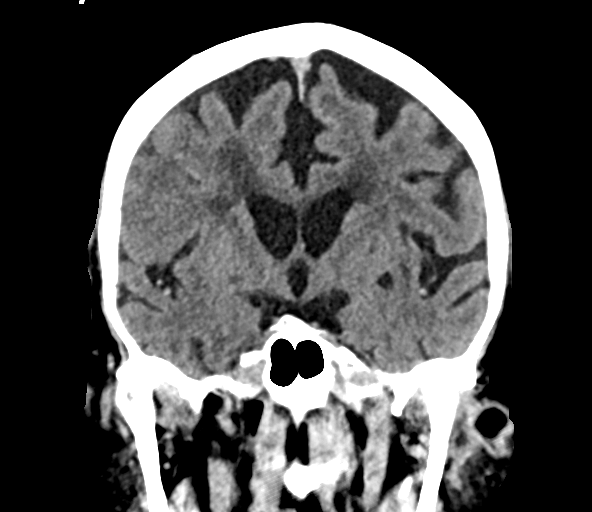

[Series 6: sag soft · sagittal · 0.41mm/px · 3 of 65 slices shown]
[im 24/65  brain]
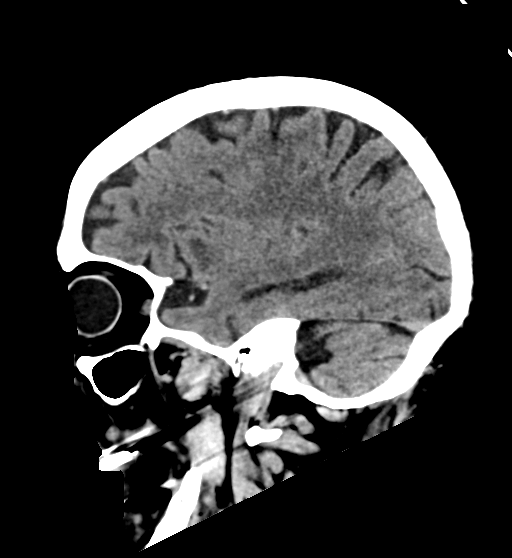
[im 33/65  brain]
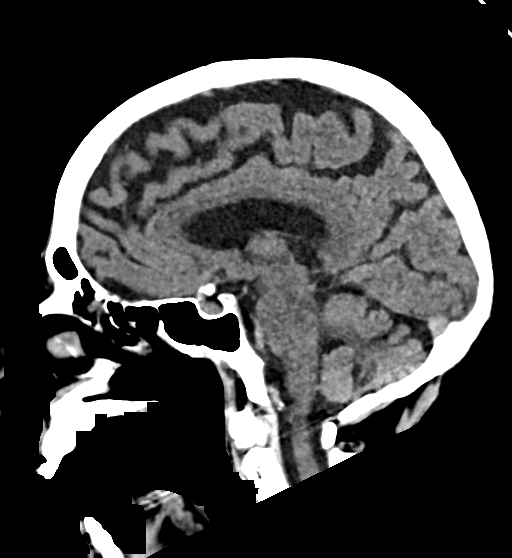
[im 41/65  brain]
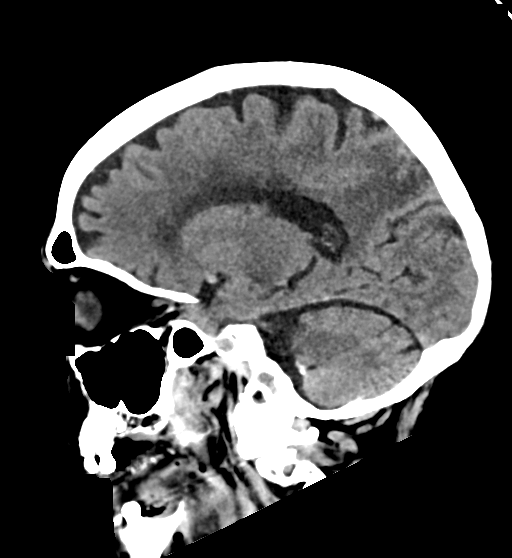

[16 of 47 positions shown; findings below may reference images not displayed]

FINDINGS: Brain: There is hypoattenuation with loss of gray-white
differentiation involving the lateral right precentral gyrus.
Superimposed gyriform hyperdensity.

Late subacute to chronic left parieto-occipital infarct is again
identified. There is no significant mass effect. Additional patchy
and confluent hypoattenuation in the supratentorial white matter
likely reflects stable chronic microvascular ischemic changes. Small
chronic right cerebellar infarct. Ventricles are stable in size.

Vascular: There is atherosclerotic calcification at the skull base.

Skull: Calvarium is unremarkable.

Sinuses/Orbits: No acute finding.

Other: None.
IMPRESSION: Evolving acute infarction of the lateral right precentral gyrus with
petechial reperfusion hemorrhage. No significant mass effect.

Otherwise stable appearance since earlier CT.

These results were communicated to Dr. Adriana Mercedes at [DATE] on
05/07/2020 by text page via the [employer] messaging system.
